# Patient Record
Sex: Female | Born: 1980 | Race: Black or African American | Hispanic: No | Marital: Single | Smoking: Never smoker
Health system: Southern US, Community
[De-identification: ages and names within clinical notes are randomized; demographics above are authoritative.]

## PROBLEM LIST (undated history)

## (undated) DIAGNOSIS — K644 Residual hemorrhoidal skin tags: Secondary | ICD-10-CM

## (undated) DIAGNOSIS — D219 Benign neoplasm of connective and other soft tissue, unspecified: Secondary | ICD-10-CM

## (undated) DIAGNOSIS — Z8619 Personal history of other infectious and parasitic diseases: Secondary | ICD-10-CM

## (undated) DIAGNOSIS — Z8709 Personal history of other diseases of the respiratory system: Secondary | ICD-10-CM

## (undated) DIAGNOSIS — T7840XA Allergy, unspecified, initial encounter: Secondary | ICD-10-CM

## (undated) DIAGNOSIS — D649 Anemia, unspecified: Secondary | ICD-10-CM

## (undated) HISTORY — DX: Anemia, unspecified: D64.9

## (undated) HISTORY — DX: Benign neoplasm of connective and other soft tissue, unspecified: D21.9

## (undated) HISTORY — DX: Allergy, unspecified, initial encounter: T78.40XA

## (undated) HISTORY — DX: Personal history of other infectious and parasitic diseases: Z86.19

## (undated) HISTORY — PX: COLONOSCOPY: SHX174

---

## 2003-07-25 ENCOUNTER — Emergency Department (HOSPITAL_COMMUNITY): Admission: EM | Admit: 2003-07-25 | Discharge: 2003-07-25 | Payer: Self-pay | Admitting: Emergency Medicine

## 2006-09-23 ENCOUNTER — Emergency Department (HOSPITAL_COMMUNITY): Admission: EM | Admit: 2006-09-23 | Discharge: 2006-09-23 | Payer: Self-pay | Admitting: Emergency Medicine

## 2007-05-29 ENCOUNTER — Other Ambulatory Visit: Admission: RE | Admit: 2007-05-29 | Discharge: 2007-05-29 | Payer: Self-pay | Admitting: Family Medicine

## 2007-06-12 ENCOUNTER — Emergency Department (HOSPITAL_COMMUNITY): Admission: EM | Admit: 2007-06-12 | Discharge: 2007-06-13 | Payer: Self-pay | Admitting: Emergency Medicine

## 2007-09-20 ENCOUNTER — Ambulatory Visit (HOSPITAL_COMMUNITY): Admission: RE | Admit: 2007-09-20 | Discharge: 2007-09-20 | Payer: Self-pay | Admitting: Obstetrics and Gynecology

## 2007-10-10 ENCOUNTER — Emergency Department (HOSPITAL_COMMUNITY): Admission: EM | Admit: 2007-10-10 | Discharge: 2007-10-11 | Payer: Self-pay | Admitting: Emergency Medicine

## 2007-11-07 ENCOUNTER — Ambulatory Visit (HOSPITAL_COMMUNITY): Admission: RE | Admit: 2007-11-07 | Discharge: 2007-11-07 | Payer: Self-pay | Admitting: Obstetrics and Gynecology

## 2008-03-26 ENCOUNTER — Inpatient Hospital Stay (HOSPITAL_COMMUNITY): Admission: AD | Admit: 2008-03-26 | Discharge: 2008-03-29 | Payer: Self-pay | Admitting: Obstetrics and Gynecology

## 2009-10-08 ENCOUNTER — Emergency Department (HOSPITAL_COMMUNITY): Admission: EM | Admit: 2009-10-08 | Discharge: 2009-10-08 | Payer: Self-pay | Admitting: Family Medicine

## 2010-01-13 ENCOUNTER — Emergency Department (HOSPITAL_COMMUNITY): Admission: EM | Admit: 2010-01-13 | Discharge: 2010-01-13 | Payer: Self-pay | Admitting: Family Medicine

## 2010-06-14 LAB — CBC
HCT: 30 % — ABNORMAL LOW (ref 36.0–46.0)
Hemoglobin: 9.9 g/dL — ABNORMAL LOW (ref 12.0–15.0)
MCV: 86.6 fL (ref 78.0–100.0)
RBC: 3.89 MIL/uL (ref 3.87–5.11)
RDW: 14.5 % (ref 11.5–15.5)
WBC: 7.4 10*3/uL (ref 4.0–10.5)

## 2010-06-14 LAB — RPR: RPR Ser Ql: NONREACTIVE

## 2010-07-13 NOTE — H&P (Signed)
NAME:  Meghan Fernandez, BIDDY NO.:  [id number]   MEDICAL RECORD NO.:  [id number]          PATIENT TYPE:  MAT   LOCATION:  [facility]                          FACILITY:  [facility]   PHYSICIAN:  Charles A. Delcambre, MDDATE OF BIRTH:  [birth year]   DATE OF ADMISSION:  DATE OF DISCHARGE:                              HISTORY & PHYSICAL   She is a 30 year old gravida 1, para 0 patient due on March 19, 2008.  Currently scheduled to be induced on March 26, 2008, 7:30 p.m. via  Cervidil at 41 weeks and 1 day expected laboring.  The pregnancy has  been complicated by an infarct and fibroid.  She has several large  fibroids even after 10 cm.  None appeared to be obstructing and pain has  decreased since first trimester. Otherwise, pregnancy complicated by  anemia.  She has used Vicodin off and on for the abdominal pain.  She is  taking prenatal vitamins and iron.   PAST SURGICAL HISTORY:  None.   PAST MEDICAL HISTORY:  History of asthma.   MEDICATIONS:  No medications used in years.   ALLERGIES:  CODEINE.  Reaction not specified, not allergic to latex.   SOCIAL HISTORY:  Unmarried, unsure of partners involved.   FAMILY HISTORY:  Unrelated.   OB LABS:  Initial hemoglobin 10.8, hematocrit 30.8, platelets 218.  A  positive, antibody screen negative, sickle cell negative.  VDRL  nonreactive, rubella immune, hepatitis B surface antigen nonreactive,  HIV nonreactive.  A 1-hour Glucola 95, hemoglobin at 28 weeks 10.7, HIV  at 36 weeks negative.  Group B strep positive at 36 weeks.  Quad screen  negative, declined cystic fibrosis, declined first trimester screening.  GC chlamydia were negative.  Pap was negative by history.   PHYSICAL EXAMINATION:  GENERAL:  Alert and oriented x3.  VITAL SIGNS:  Fetal heart rate 150s at 39 weeks and 6 days.  Fetal  movement is noted.  Weight 83 pounds.  Urine glucose, protein negative.  Blood pressure 112/78.  LUNGS:  Clear bilaterally.  Respirations  clear.  HEART:  Regular rate and rhythm, 2/6 systolic ejection murmur, left  sternal border.  ABDOMEN:  Gravid.  Fundal height is 44 with fibroids noted, nontender,  fetal heart rate in 150s.  EXTREMITIES:  Minimal edema bilaterally.  CERVIX:  Posterior and fingertip 1 cm most.   ASSESSMENT:  1. Intrauterine pregnancy, to be 41 weeks and 1 day estimated      gestational age.  2. Large fibroids.  3. Anemia.   PLAN:  Admit for induction at 7:30 p.m. on March 26, 2008, with  Cervidil.  All questions were answered.  Ambien 10 mg at bedtime p.r.n.  She may eat while she is not in labor with Cervidil intact.  If unable  to place Cervidil secondary to contractions, run Pitocin at 2 milli-  international units times per minute overnight.  In case of rupture of  membranes at appropriate time, begin low-dose Pitocin      Charles A. Sydnee Cabal, MD  Electronically Signed     CAD/MEDQ  D:  03/18/2008  T:  03/19/2008  Job:  [id number]

## 2010-09-10 LAB — ABO/RH: RH Type: POSITIVE

## 2010-09-10 LAB — GC/CHLAMYDIA PROBE AMP, GENITAL: Chlamydia: NEGATIVE

## 2010-09-10 LAB — RUBELLA ANTIBODY, IGM: Rubella: IMMUNE

## 2010-11-03 ENCOUNTER — Other Ambulatory Visit (HOSPITAL_COMMUNITY): Payer: Self-pay | Admitting: Obstetrics & Gynecology

## 2010-11-03 DIAGNOSIS — Z3682 Encounter for antenatal screening for nuchal translucency: Secondary | ICD-10-CM

## 2010-11-03 DIAGNOSIS — Z3689 Encounter for other specified antenatal screening: Secondary | ICD-10-CM

## 2010-11-03 DIAGNOSIS — O2690 Pregnancy related conditions, unspecified, unspecified trimester: Secondary | ICD-10-CM

## 2010-11-16 ENCOUNTER — Encounter (HOSPITAL_COMMUNITY): Payer: Self-pay

## 2010-11-16 ENCOUNTER — Ambulatory Visit (HOSPITAL_COMMUNITY)
Admission: RE | Admit: 2010-11-16 | Discharge: 2010-11-16 | Disposition: A | Discharge status: Home / Self Care | Payer: 59 | Source: Ambulatory Visit | Attending: Obstetrics & Gynecology | Admitting: Obstetrics & Gynecology

## 2010-11-16 ENCOUNTER — Ambulatory Visit (HOSPITAL_COMMUNITY): Payer: 59

## 2010-11-16 ENCOUNTER — Other Ambulatory Visit: Payer: Self-pay

## 2010-11-16 DIAGNOSIS — O3410 Maternal care for benign tumor of corpus uteri, unspecified trimester: Secondary | ICD-10-CM | POA: Insufficient documentation

## 2010-11-16 DIAGNOSIS — Z3689 Encounter for other specified antenatal screening: Secondary | ICD-10-CM

## 2010-11-16 DIAGNOSIS — O351XX0 Maternal care for (suspected) chromosomal abnormality in fetus, not applicable or unspecified: Secondary | ICD-10-CM | POA: Insufficient documentation

## 2010-11-16 DIAGNOSIS — Z3682 Encounter for antenatal screening for nuchal translucency: Secondary | ICD-10-CM

## 2010-11-16 DIAGNOSIS — O3510X0 Maternal care for (suspected) chromosomal abnormality in fetus, unspecified, not applicable or unspecified: Secondary | ICD-10-CM | POA: Insufficient documentation

## 2010-11-16 NOTE — Progress Notes (Signed)
Patient seen for ultrasound appointment today.  Please see [facility] report for details.  

## 2010-11-23 LAB — GC/CHLAMYDIA PROBE AMP, GENITAL
Chlamydia, DNA Probe: NEGATIVE
GC Probe Amp, Genital: NEGATIVE

## 2010-11-23 LAB — URINALYSIS, ROUTINE W REFLEX MICROSCOPIC
Glucose, UA: NEGATIVE
Hgb urine dipstick: NEGATIVE
Protein, ur: NEGATIVE
Specific Gravity, Urine: 1.025
pH: 7

## 2010-11-23 LAB — WET PREP, GENITAL: Yeast Wet Prep HPF POC: NONE SEEN

## 2010-11-23 LAB — URINE MICROSCOPIC-ADD ON

## 2010-12-28 ENCOUNTER — Ambulatory Visit (HOSPITAL_COMMUNITY)
Admission: RE | Admit: 2010-12-28 | Discharge: 2010-12-28 | Disposition: A | Discharge status: Home / Self Care | Payer: 59 | Source: Ambulatory Visit | Attending: Obstetrics & Gynecology | Admitting: Obstetrics & Gynecology

## 2010-12-28 DIAGNOSIS — Z1389 Encounter for screening for other disorder: Secondary | ICD-10-CM | POA: Insufficient documentation

## 2010-12-28 DIAGNOSIS — Z3689 Encounter for other specified antenatal screening: Secondary | ICD-10-CM

## 2010-12-28 DIAGNOSIS — Z363 Encounter for antenatal screening for malformations: Secondary | ICD-10-CM | POA: Insufficient documentation

## 2010-12-28 DIAGNOSIS — O358XX0 Maternal care for other (suspected) fetal abnormality and damage, not applicable or unspecified: Secondary | ICD-10-CM | POA: Insufficient documentation

## 2010-12-28 DIAGNOSIS — O3410 Maternal care for benign tumor of corpus uteri, unspecified trimester: Secondary | ICD-10-CM | POA: Insufficient documentation

## 2010-12-28 DIAGNOSIS — O2690 Pregnancy related conditions, unspecified, unspecified trimester: Secondary | ICD-10-CM

## 2011-01-25 ENCOUNTER — Encounter (HOSPITAL_COMMUNITY): Payer: Self-pay

## 2011-01-25 ENCOUNTER — Ambulatory Visit (HOSPITAL_COMMUNITY)
Admission: RE | Admit: 2011-01-25 | Discharge: 2011-01-25 | Disposition: A | Discharge status: Home / Self Care | Payer: 59 | Source: Ambulatory Visit | Attending: Obstetrics & Gynecology | Admitting: Obstetrics & Gynecology

## 2011-01-25 VITALS — BP 113/71 | HR 93 | Wt 276.5 lb

## 2011-01-25 DIAGNOSIS — O99210 Obesity complicating pregnancy, unspecified trimester: Secondary | ICD-10-CM

## 2011-01-25 DIAGNOSIS — O2690 Pregnancy related conditions, unspecified, unspecified trimester: Secondary | ICD-10-CM

## 2011-01-25 DIAGNOSIS — O3410 Maternal care for benign tumor of corpus uteri, unspecified trimester: Secondary | ICD-10-CM | POA: Insufficient documentation

## 2011-01-25 DIAGNOSIS — Z0489 Encounter for examination and observation for other specified reasons: Secondary | ICD-10-CM

## 2011-01-25 DIAGNOSIS — E669 Obesity, unspecified: Secondary | ICD-10-CM | POA: Insufficient documentation

## 2011-01-25 NOTE — Progress Notes (Signed)
Meghan Fernandez was seen for ultrasound appointment today.  Please see [facility] report for details.  

## 2011-02-24 ENCOUNTER — Ambulatory Visit (HOSPITAL_COMMUNITY)
Admission: RE | Admit: 2011-02-24 | Discharge: 2011-02-24 | Disposition: A | Discharge status: Home / Self Care | Payer: 59 | Source: Ambulatory Visit | Attending: Obstetrics & Gynecology | Admitting: Obstetrics & Gynecology

## 2011-02-24 DIAGNOSIS — E669 Obesity, unspecified: Secondary | ICD-10-CM | POA: Insufficient documentation

## 2011-02-24 DIAGNOSIS — Z0489 Encounter for examination and observation for other specified reasons: Secondary | ICD-10-CM

## 2011-02-24 DIAGNOSIS — O99210 Obesity complicating pregnancy, unspecified trimester: Secondary | ICD-10-CM | POA: Insufficient documentation

## 2011-02-24 DIAGNOSIS — O3410 Maternal care for benign tumor of corpus uteri, unspecified trimester: Secondary | ICD-10-CM | POA: Insufficient documentation

## 2011-03-01 NOTE — L&D Delivery Note (Signed)
Patient was C/C/+2 and pushed for 10 minutes with epidural.   NSVD  female infant, Apgars 8,9, weight P.   The patient had one midline second degree midline lacerations repaired with 2-0 vicryl R; a small sub-clitoral abrasion was not bleeding. Fundus was firm. EBL was expected. Placenta was delivered intact. Vagina was clear.  Baby was vigorous to bedside.  Meghan Fernandez A

## 2011-04-22 LAB — STREP B DNA PROBE: GBS: POSITIVE

## 2011-05-31 ENCOUNTER — Inpatient Hospital Stay (HOSPITAL_COMMUNITY)
Admission: AD | Admit: 2011-05-31 | Discharge: 2011-06-02 | DRG: 775 | Disposition: A | Discharge status: Home / Self Care | Payer: 59 | Source: Ambulatory Visit | Attending: Obstetrics and Gynecology | Admitting: Obstetrics and Gynecology

## 2011-05-31 ENCOUNTER — Encounter (HOSPITAL_COMMUNITY): Payer: Self-pay | Admitting: Obstetrics and Gynecology

## 2011-05-31 ENCOUNTER — Encounter (HOSPITAL_COMMUNITY): Payer: Self-pay | Admitting: *Deleted

## 2011-05-31 ENCOUNTER — Encounter (HOSPITAL_COMMUNITY): Payer: Self-pay | Admitting: Anesthesiology

## 2011-05-31 ENCOUNTER — Inpatient Hospital Stay (HOSPITAL_COMMUNITY): Payer: 59 | Admitting: Anesthesiology

## 2011-05-31 ENCOUNTER — Telehealth (HOSPITAL_COMMUNITY): Payer: Self-pay | Admitting: *Deleted

## 2011-05-31 LAB — CBC
HCT: 33.6 % — ABNORMAL LOW (ref 36.0–46.0)
Hemoglobin: 11.1 g/dL — ABNORMAL LOW (ref 12.0–15.0)
MCH: 27.5 pg (ref 26.0–34.0)
MCHC: 33 g/dL (ref 30.0–36.0)
RBC: 4.03 MIL/uL (ref 3.87–5.11)

## 2011-05-31 MED ORDER — METHYLERGONOVINE MALEATE 0.2 MG/ML IJ SOLN: 0.2000 mg | INTRAMUSCULAR | Status: DC | PRN

## 2011-05-31 MED ORDER — PHENYLEPHRINE 40 MCG/ML (10ML) SYRINGE FOR IV PUSH (FOR BLOOD PRESSURE SUPPORT): 80.0000 ug | PREFILLED_SYRINGE | INTRAVENOUS | Status: DC | PRN

## 2011-05-31 MED ORDER — DIPHENHYDRAMINE HCL 50 MG/ML IJ SOLN: 12.5000 mg | INTRAMUSCULAR | Status: DC | PRN

## 2011-05-31 MED ORDER — LACTATED RINGERS IV SOLN: INTRAVENOUS | Status: DC

## 2011-05-31 MED ORDER — ONDANSETRON HCL 4 MG PO TABS
4.0000 mg | ORAL_TABLET | ORAL | Status: DC | PRN
  Administered 2011-06-01: 4 mg via ORAL
  Filled 2011-05-31: qty 1

## 2011-05-31 MED ORDER — DIPHENHYDRAMINE HCL 25 MG PO CAPS: 25.0000 mg | ORAL_CAPSULE | Freq: Four times a day (QID) | ORAL | Status: DC | PRN

## 2011-05-31 MED ORDER — OXYTOCIN 20 UNITS IN LACTATED RINGERS INFUSION - SIMPLE
125.0000 mL/h | Freq: Once | INTRAVENOUS | Status: AC
  Administered 2011-05-31: 500 mL/h via INTRAVENOUS

## 2011-05-31 MED ORDER — OXYTOCIN BOLUS FROM INFUSION
500.0000 mL | Freq: Once | INTRAVENOUS | Status: DC
  Filled 2011-05-31: qty 500

## 2011-05-31 MED ORDER — CITRIC ACID-SODIUM CITRATE 334-500 MG/5ML PO SOLN: 30.0000 mL | ORAL | Status: DC | PRN

## 2011-05-31 MED ORDER — EPHEDRINE 5 MG/ML INJ: 10.0000 mg | INTRAVENOUS | Status: DC | PRN

## 2011-05-31 MED ORDER — ZOLPIDEM TARTRATE 5 MG PO TABS: 5.0000 mg | ORAL_TABLET | Freq: Every evening | ORAL | Status: DC | PRN

## 2011-05-31 MED ORDER — IBUPROFEN 800 MG PO TABS
800.0000 mg | ORAL_TABLET | Freq: Three times a day (TID) | ORAL | Status: DC
  Administered 2011-06-01 – 2011-06-02 (×3): 800 mg via ORAL
  Filled 2011-05-31 (×3): qty 1

## 2011-05-31 MED ORDER — SODIUM CHLORIDE 0.9 % IV SOLN: 250.0000 mL | INTRAVENOUS | Status: DC | PRN

## 2011-05-31 MED ORDER — ACETAMINOPHEN 325 MG PO TABS: 650.0000 mg | ORAL_TABLET | ORAL | Status: DC | PRN

## 2011-05-31 MED ORDER — FLEET ENEMA 7-19 GM/118ML RE ENEM: 1.0000 | ENEMA | RECTAL | Status: DC | PRN

## 2011-05-31 MED ORDER — LACTATED RINGERS IV SOLN
500.0000 mL | INTRAVENOUS | Status: DC | PRN
  Administered 2011-05-31: 1000 mL via INTRAVENOUS

## 2011-05-31 MED ORDER — LACTATED RINGERS IV SOLN: 500.0000 mL | INTRAVENOUS | Status: DC | PRN

## 2011-05-31 MED ORDER — EPHEDRINE 5 MG/ML INJ
10.0000 mg | INTRAVENOUS | Status: DC | PRN
  Filled 2011-05-31: qty 4

## 2011-05-31 MED ORDER — BENZOCAINE-MENTHOL 20-0.5 % EX AERO: 1.0000 "application " | INHALATION_SPRAY | CUTANEOUS | Status: DC | PRN

## 2011-05-31 MED ORDER — OXYCODONE-ACETAMINOPHEN 5-325 MG PO TABS: 1.0000 | ORAL_TABLET | ORAL | Status: DC | PRN

## 2011-05-31 MED ORDER — OXYTOCIN 20 UNITS IN LACTATED RINGERS INFUSION - SIMPLE: 125.0000 mL/h | INTRAVENOUS | Status: DC | PRN

## 2011-05-31 MED ORDER — SENNOSIDES-DOCUSATE SODIUM 8.6-50 MG PO TABS
2.0000 | ORAL_TABLET | Freq: Every day | ORAL | Status: DC
  Administered 2011-06-01: 2 via ORAL

## 2011-05-31 MED ORDER — LIDOCAINE HCL (PF) 1 % IJ SOLN
30.0000 mL | INTRAMUSCULAR | Status: DC | PRN
  Filled 2011-05-31: qty 30

## 2011-05-31 MED ORDER — WITCH HAZEL-GLYCERIN EX PADS
1.0000 "application " | MEDICATED_PAD | CUTANEOUS | Status: DC | PRN
  Administered 2011-06-01: 1 via TOPICAL

## 2011-05-31 MED ORDER — OXYTOCIN BOLUS FROM INFUSION
500.0000 mL | Freq: Once | INTRAVENOUS | Status: DC
  Filled 2011-05-31: qty 500
  Filled 2011-05-31: qty 1000

## 2011-05-31 MED ORDER — TETANUS-DIPHTH-ACELL PERTUSSIS 5-2.5-18.5 LF-MCG/0.5 IM SUSP
0.5000 mL | Freq: Once | INTRAMUSCULAR | Status: AC
  Administered 2011-06-01: 0.5 mL via INTRAMUSCULAR
  Filled 2011-05-31: qty 0.5

## 2011-05-31 MED ORDER — LIDOCAINE HCL (PF) 1 % IJ SOLN: 30.0000 mL | INTRAMUSCULAR | Status: DC | PRN

## 2011-05-31 MED ORDER — PHENYLEPHRINE 40 MCG/ML (10ML) SYRINGE FOR IV PUSH (FOR BLOOD PRESSURE SUPPORT)
80.0000 ug | PREFILLED_SYRINGE | INTRAVENOUS | Status: DC | PRN
  Filled 2011-05-31: qty 5

## 2011-05-31 MED ORDER — LIDOCAINE HCL (PF) 1 % IJ SOLN
INTRAMUSCULAR | Status: DC | PRN
  Administered 2011-05-31 (×2): 5 mL

## 2011-05-31 MED ORDER — LANOLIN HYDROUS EX OINT: TOPICAL_OINTMENT | CUTANEOUS | Status: DC | PRN

## 2011-05-31 MED ORDER — OXYCODONE-ACETAMINOPHEN 5-325 MG PO TABS
1.0000 | ORAL_TABLET | ORAL | Status: DC | PRN
  Administered 2011-06-01: 2 via ORAL
  Administered 2011-06-02: 1 via ORAL
  Filled 2011-05-31: qty 1
  Filled 2011-05-31: qty 2

## 2011-05-31 MED ORDER — PENICILLIN G POTASSIUM 5000000 UNITS IJ SOLR
5.0000 10*6.[IU] | Freq: Once | INTRAVENOUS | Status: AC
  Administered 2011-05-31: 5 10*6.[IU] via INTRAVENOUS
  Filled 2011-05-31: qty 5

## 2011-05-31 MED ORDER — METHYLERGONOVINE MALEATE 0.2 MG PO TABS: 0.2000 mg | ORAL_TABLET | ORAL | Status: DC | PRN

## 2011-05-31 MED ORDER — SODIUM CHLORIDE 0.9 % IJ SOLN: 3.0000 mL | Freq: Two times a day (BID) | INTRAMUSCULAR | Status: DC

## 2011-05-31 MED ORDER — DM-GUAIFENESIN ER 30-600 MG PO TB12
1.0000 | ORAL_TABLET | Freq: Two times a day (BID) | ORAL | Status: DC
  Administered 2011-06-01 – 2011-06-02 (×3): 1 via ORAL
  Filled 2011-05-31 (×4): qty 1

## 2011-05-31 MED ORDER — IBUPROFEN 600 MG PO TABS
600.0000 mg | ORAL_TABLET | Freq: Four times a day (QID) | ORAL | Status: DC | PRN
  Filled 2011-05-31: qty 1

## 2011-05-31 MED ORDER — DIBUCAINE 1 % RE OINT
1.0000 "application " | TOPICAL_OINTMENT | RECTAL | Status: DC | PRN
  Administered 2011-06-01: 1 via RECTAL
  Filled 2011-05-31: qty 28

## 2011-05-31 MED ORDER — LACTATED RINGERS IV SOLN
500.0000 mL | Freq: Once | INTRAVENOUS | Status: AC
  Administered 2011-05-31: 500 mL via INTRAVENOUS

## 2011-05-31 MED ORDER — MEASLES, MUMPS & RUBELLA VAC ~~LOC~~ INJ
0.5000 mL | INJECTION | Freq: Once | SUBCUTANEOUS | Status: DC
  Filled 2011-05-31: qty 0.5

## 2011-05-31 MED ORDER — SIMETHICONE 80 MG PO CHEW: 80.0000 mg | CHEWABLE_TABLET | ORAL | Status: DC | PRN

## 2011-05-31 MED ORDER — ONDANSETRON HCL 4 MG/2ML IJ SOLN: 4.0000 mg | Freq: Four times a day (QID) | INTRAMUSCULAR | Status: DC | PRN

## 2011-05-31 MED ORDER — PRENATAL MULTIVITAMIN CH
1.0000 | ORAL_TABLET | Freq: Every day | ORAL | Status: DC
  Administered 2011-06-01 – 2011-06-02 (×2): 1 via ORAL
  Filled 2011-05-31 (×2): qty 1

## 2011-05-31 MED ORDER — FERROUS SULFATE 325 (65 FE) MG PO TABS
325.0000 mg | ORAL_TABLET | Freq: Two times a day (BID) | ORAL | Status: DC
  Administered 2011-06-01 – 2011-06-02 (×3): 325 mg via ORAL
  Filled 2011-05-31 (×3): qty 1

## 2011-05-31 MED ORDER — LACTATED RINGERS IV SOLN
INTRAVENOUS | Status: DC
  Administered 2011-05-31: 12:00:00 via INTRAVENOUS

## 2011-05-31 MED ORDER — IBUPROFEN 600 MG PO TABS
600.0000 mg | ORAL_TABLET | Freq: Four times a day (QID) | ORAL | Status: DC | PRN
  Administered 2011-05-31: 600 mg via ORAL

## 2011-05-31 MED ORDER — NALBUPHINE SYRINGE 5 MG/0.5 ML: 5.0000 mg | INJECTION | INTRAMUSCULAR | Status: DC | PRN

## 2011-05-31 MED ORDER — MAGNESIUM HYDROXIDE 400 MG/5ML PO SUSP: 30.0000 mL | ORAL | Status: DC | PRN

## 2011-05-31 MED ORDER — OXYTOCIN 20 UNITS IN LACTATED RINGERS INFUSION - SIMPLE
125.0000 mL/h | Freq: Once | INTRAVENOUS | Status: AC
  Administered 2011-05-31: 125 mL/h via INTRAVENOUS

## 2011-05-31 MED ORDER — ONDANSETRON HCL 4 MG/2ML IJ SOLN: 4.0000 mg | INTRAMUSCULAR | Status: DC | PRN

## 2011-05-31 MED ORDER — SODIUM CHLORIDE 0.9 % IJ SOLN: 3.0000 mL | INTRAMUSCULAR | Status: DC | PRN

## 2011-05-31 MED ORDER — PENICILLIN G POTASSIUM 5000000 UNITS IJ SOLR
2.5000 10*6.[IU] | INTRAVENOUS | Status: DC
  Administered 2011-05-31: 2.5 10*6.[IU] via INTRAVENOUS
  Filled 2011-05-31 (×4): qty 2.5

## 2011-05-31 MED ORDER — FENTANYL 2.5 MCG/ML BUPIVACAINE 1/10 % EPIDURAL INFUSION (WH - ANES)
14.0000 mL/h | INTRAMUSCULAR | Status: DC
  Administered 2011-05-31: 14 mL/h via EPIDURAL
  Filled 2011-05-31: qty 60

## 2011-05-31 NOTE — MAU Note (Signed)
Pt sent from the office for labor. Pt was 5 cm in the office; G2P1 at [redacted]w[redacted]d

## 2011-05-31 NOTE — Anesthesia Preprocedure Evaluation (Signed)
Anesthesia Evaluation  Patient identified by MRN, date of birth, ID band Patient awake    Reviewed: Allergy & Precautions, H&P , Patient's Chart, lab work & pertinent test results  Airway Mallampati: III TM Distance: >3 FB Neck ROM: full    Dental No notable dental hx.    Pulmonary neg pulmonary ROS, asthma ,  breath sounds clear to auscultation  Pulmonary exam normal       Cardiovascular negative cardio ROS  Rhythm:regular Rate:Normal     Neuro/Psych negative neurological ROS  negative psych ROS   GI/Hepatic negative GI ROS, Neg liver ROS,   Endo/Other  negative endocrine ROSMorbid obesity  Renal/GU negative Renal ROS     Musculoskeletal   Abdominal   Peds  Hematology negative hematology ROS (+)   Anesthesia Other Findings   Reproductive/Obstetrics (+) Pregnancy                           Anesthesia Physical Anesthesia Plan  ASA: III  Anesthesia Plan: Epidural   Post-op Pain Management:    Induction:   Airway Management Planned:   Additional Equipment:   Intra-op Plan:   Post-operative Plan:   Informed Consent: I have reviewed the patients History and Physical, chart, labs and discussed the procedure including the risks, benefits and alternatives for the proposed anesthesia with the patient or authorized representative who has indicated his/her understanding and acceptance.     Plan Discussed with:   Anesthesia Plan Comments:         Anesthesia Quick Evaluation  

## 2011-05-31 NOTE — H&P (Signed)
30 y.o. [redacted]w[redacted]d  G2P1001 comes in c/o labor.  Otherwise has good fetal movement and no bleeding.  Past Medical History  Diagnosis Date  . History of chicken pox   . Fibroids   . Anemia   . Asthma     childhood no inhaler now  . Hemorrhoids in pregnancy    No past surgical history on file.  OB History    Grav Para Term Preterm Abortions TAB SAB Ect Mult Living   2 1 1  0 0 0 0 0 0 1     # Outc Date GA Lbr Len/2nd Wgt Sex Del Anes PTL Lv   1 TRM 2010 [redacted]w[redacted]d 05:00 7lb9oz(3.43kg) F SVD EPI  Yes   2 CUR               History   Social History  . Marital Status: Single    Spouse Name: N/A    Number of Children: N/A  . Years of Education: N/A   Occupational History  . Not on file.   Social History Main Topics  . Smoking status: Never Smoker   . Smokeless tobacco: Never Used  . Alcohol Use: No  . Drug Use: No  . Sexually Active: Yes   Other Topics Concern  . Not on file   Social History Narrative  . No narrative on file   Review of patient's allergies indicates no known allergies.   Prenatal Course:  Uncomplicated.   Filed Vitals:   05/31/11 1205  BP: 119/76  Pulse: 102  Temp: 97.8 F (36.6 C)  Resp: 18     Lungs/Cor:  NAD Abdomen:  soft, gravid Ex:  no cords, erythema SVE:  5/c/-2 FHTs:  120s, good STV, NST R Toco:  q5-15   A/P   Labor at term.  GBS Pos- PCN.  Meghan Fernandez A

## 2011-05-31 NOTE — Telephone Encounter (Signed)
Preadmission screen  

## 2011-05-31 NOTE — Progress Notes (Signed)
Dr. Henderson Cloud notified of patient arrival to MAU. L/D not able to take patient at this time, asked if MAU could take care of patient until bed is available. Dr Henderson Cloud notified of bed status. Orders received.

## 2011-05-31 NOTE — Anesthesia Procedure Notes (Signed)
Epidural Patient location during procedure: OB Start time: 05/31/2011 3:09 PM  Staffing Anesthesiologist: Brayton Caves R Performed by: anesthesiologist   Preanesthetic Checklist Completed: patient identified, site marked, surgical consent, pre-op evaluation, timeout performed, IV checked, risks and benefits discussed and monitors and equipment checked  Epidural Patient position: sitting Prep: site prepped and draped and DuraPrep Patient monitoring: continuous pulse ox and blood pressure Approach: midline Injection technique: LOR air and LOR saline  Needle:  Needle type: Tuohy  Needle gauge: 17 G Needle length: 9 cm Needle insertion depth: 7 cm Catheter type: closed end flexible Catheter size: 19 Gauge Catheter at skin depth: 12 cm Test dose: negative  Assessment Events: blood not aspirated, injection not painful, no injection resistance, negative IV test and no paresthesia  Additional Notes Patient identified.  Risk benefits discussed including failed block, incomplete pain control, headache, nerve damage, paralysis, blood pressure changes, nausea, vomiting, reactions to medication both toxic or allergic, and postpartum back pain.  Patient expressed understanding and wished to proceed.  All questions were answered.  Sterile technique used throughout procedure and epidural site dressed with sterile barrier dressing. No paresthesia or other complications noted.The patient did not experience any signs of intravascular injection such as tinnitus or metallic taste in mouth nor signs of intrathecal spread such as rapid motor block. Please see nursing notes for vital signs.

## 2011-06-01 ENCOUNTER — Encounter (HOSPITAL_COMMUNITY): Payer: Self-pay | Admitting: *Deleted

## 2011-06-01 LAB — CBC
MCH: 27.3 pg (ref 26.0–34.0)
MCHC: 32.6 g/dL (ref 30.0–36.0)
Platelets: 161 10*3/uL (ref 150–400)
RBC: 4 MIL/uL (ref 3.87–5.11)

## 2011-06-01 MED ORDER — BENZOCAINE-MENTHOL 20-0.5 % EX AERO
INHALATION_SPRAY | CUTANEOUS | Status: AC
  Administered 2011-06-01: 02:00:00
  Filled 2011-06-01: qty 56

## 2011-06-01 MED ORDER — HYDROCORTISONE ACE-PRAMOXINE 1-1 % RE FOAM
1.0000 | Freq: Two times a day (BID) | RECTAL | Status: DC
  Administered 2011-06-01 – 2011-06-02 (×2): 1 via RECTAL
  Filled 2011-06-01: qty 10

## 2011-06-01 NOTE — Progress Notes (Signed)
Post Partum Day 1 Subjective: Doing well.  Lochia appropriate.  Perineum sore secondary to tear.  Also c/o hemorrhoids  Objective: Filed Vitals:   05/31/11 2036 05/31/11 2136 06/01/11 0144 06/01/11 0826  BP: 111/71 134/78 106/69 114/74  Pulse: 55 55 56 56  Temp: 97 F (36.1 C) 98.1 F (36.7 C) 97.4 F (36.3 C) 98.4 F (36.9 C)  TempSrc: Oral Oral Oral Oral  Resp: 18 18 20 18   Height:      Weight:      SpO2: 100% 98% 98% 98%     Physical Exam:  General: alert, cooperative and appears stated age Lochia: appropriate Uterine Fundus: firm     Basename 06/01/11 0600 05/31/11 1212  HGB 10.9* 11.1*  HCT 33.4* 33.6*    Assessment/Plan: Routine postpartum care Breastfeeding Proctofoam   LOS: 1 day   Maicy Filip H. 06/01/2011, 9:34 AM

## 2011-06-01 NOTE — Anesthesia Postprocedure Evaluation (Signed)
  Anesthesia Post-op Note  Patient: Meghan Fernandez  Procedure(s) Performed: * No procedures listed *  Patient Location: Mother/Baby  Anesthesia Type: Epidural  Level of Consciousness: oriented  Airway and Oxygen Therapy: Patient Spontanous Breathing  Post-op Pain: mild  Post-op Assessment: Patient's Cardiovascular Status Stable and Respiratory Function Stable  Post-op Vital Signs: stable  Complications: No apparent anesthesia complications

## 2011-06-02 MED ORDER — IBUPROFEN 800 MG PO TABS: 800.0000 mg | ORAL_TABLET | Freq: Three times a day (TID) | ORAL | Status: AC

## 2011-06-02 NOTE — Discharge Summary (Signed)
Obstetric Discharge Summary Reason for Admission: onset of labor Prenatal Procedures: none Intrapartum Procedures: spontaneous vaginal delivery Postpartum Procedures: none Complications-Operative and Postpartum: 2 degree perineal laceration Hemoglobin  Date Value Range Status  06/01/2011 10.9* 12.0-15.0 (g/dL) Final     HCT  Date Value Range Status  06/01/2011 33.4* 36.0-46.0 (%) Final    Physical Exam:  General: alert and cooperative Lochia: appropriate Uterine Fundus: firm  DVT Evaluation: No evidence of DVT seen on physical exam.  Discharge Diagnoses: Term Pregnancy-delivered  Discharge Information: Date: 06/02/2011 Activity: pelvic rest Diet: routine Medications: PNV and Ibuprofen Condition: stable Instructions: refer to practice specific booklet Discharge to: home Follow-up Information    Follow up with HORVATH,MICHELLE A, MD in 4 weeks.   Contact information:   [address] [place] [address] [phone]          Newborn Data: Live born female  Birth Weight: 7 lb 13.2 oz (3549 g) APGAR: , 9  Home with mother.  Philip Aspen 06/02/2011, 9:58 AM

## 2011-06-03 ENCOUNTER — Inpatient Hospital Stay (HOSPITAL_COMMUNITY): Admission: RE | Admit: 2011-06-03 | Payer: 59 | Source: Ambulatory Visit

## 2011-06-29 ENCOUNTER — Encounter (HOSPITAL_COMMUNITY): Payer: Self-pay | Admitting: *Deleted

## 2012-04-30 ENCOUNTER — Other Ambulatory Visit (HOSPITAL_COMMUNITY)
Admission: RE | Admit: 2012-04-30 | Discharge: 2012-04-30 | Disposition: A | Discharge status: Home / Self Care | Payer: 59 | Source: Ambulatory Visit | Attending: Obstetrics and Gynecology | Admitting: Obstetrics and Gynecology

## 2012-04-30 ENCOUNTER — Other Ambulatory Visit: Payer: Self-pay | Admitting: Obstetrics and Gynecology

## 2012-04-30 DIAGNOSIS — Z1151 Encounter for screening for human papillomavirus (HPV): Secondary | ICD-10-CM | POA: Insufficient documentation

## 2012-04-30 DIAGNOSIS — Z01419 Encounter for gynecological examination (general) (routine) without abnormal findings: Secondary | ICD-10-CM | POA: Insufficient documentation

## 2013-12-30 ENCOUNTER — Encounter (HOSPITAL_COMMUNITY): Payer: Self-pay | Admitting: *Deleted

## 2015-05-22 ENCOUNTER — Ambulatory Visit: Payer: Self-pay | Admitting: General Surgery

## 2015-06-08 ENCOUNTER — Other Ambulatory Visit (HOSPITAL_COMMUNITY)
Admission: RE | Admit: 2015-06-08 | Discharge: 2015-06-08 | Disposition: A | Discharge status: Home / Self Care | Payer: 59 | Source: Ambulatory Visit | Attending: Internal Medicine | Admitting: Internal Medicine

## 2015-06-08 ENCOUNTER — Other Ambulatory Visit: Payer: Self-pay | Admitting: Internal Medicine

## 2015-06-08 DIAGNOSIS — Z1151 Encounter for screening for human papillomavirus (HPV): Secondary | ICD-10-CM | POA: Diagnosis not present

## 2015-06-08 DIAGNOSIS — Z01419 Encounter for gynecological examination (general) (routine) without abnormal findings: Secondary | ICD-10-CM | POA: Diagnosis present

## 2015-06-10 LAB — CYTOLOGY - PAP

## 2015-08-27 ENCOUNTER — Ambulatory Visit: Payer: Self-pay | Admitting: General Surgery

## 2015-09-28 ENCOUNTER — Encounter (HOSPITAL_BASED_OUTPATIENT_CLINIC_OR_DEPARTMENT_OTHER): Payer: Self-pay | Admitting: *Deleted

## 2015-09-28 NOTE — Progress Notes (Signed)
CALLED AND SPOKE W/ PT, TO DO PRE-OP.  PT STATES DOES NOT HAVE AN ADULT ARRANGED TO PICK UP AT DISCHARGE.  PER POLICY/ GUIDELINES PT'S ARE TO HAVE RESPONSIBLE ADULT >=35 YRS OLD DUE TO HAVING ANESTHESIA.  VERBALIZED UNDERSTANDING AND GAVE HER OR SCHEDULER NUMBER TO RESCHEDULE .

## 2015-11-05 ENCOUNTER — Ambulatory Visit (HOSPITAL_BASED_OUTPATIENT_CLINIC_OR_DEPARTMENT_OTHER): Admission: RE | Admit: 2015-11-05 | Payer: 59 | Source: Ambulatory Visit | Admitting: General Surgery

## 2015-11-05 ENCOUNTER — Encounter (HOSPITAL_BASED_OUTPATIENT_CLINIC_OR_DEPARTMENT_OTHER): Admission: RE | Payer: Self-pay | Source: Ambulatory Visit

## 2015-11-05 HISTORY — DX: Personal history of other diseases of the respiratory system: Z87.09

## 2015-11-05 HISTORY — DX: Residual hemorrhoidal skin tags: K64.4

## 2015-11-05 SURGERY — HEMORRHOIDECTOMY
Anesthesia: Choice

## 2016-07-12 ENCOUNTER — Other Ambulatory Visit: Payer: Self-pay | Admitting: Internal Medicine

## 2016-07-12 ENCOUNTER — Ambulatory Visit
Admission: RE | Admit: 2016-07-12 | Discharge: 2016-07-12 | Disposition: A | Discharge status: Home / Self Care | Payer: BLUE CROSS/BLUE SHIELD | Source: Ambulatory Visit | Attending: Internal Medicine | Admitting: Internal Medicine

## 2016-07-12 DIAGNOSIS — M25562 Pain in left knee: Secondary | ICD-10-CM

## 2016-07-12 DIAGNOSIS — M25561 Pain in right knee: Secondary | ICD-10-CM

## 2018-09-11 IMAGING — DX DG KNEE COMPLETE 4+V*R*
4 series · 4 of 4 positions shown · non-contrast
Comparison: None.

CLINICAL DATA: 35-year-old female bilateral knee pain which
increases with activity. Pain for several months more noticeable
last few weeks. No reported trauma. Initial encounter.

EXAM:
RIGHT KNEE - COMPLETE 4+ VIEW

[dg knee complete 4 views right (1 of 4)]
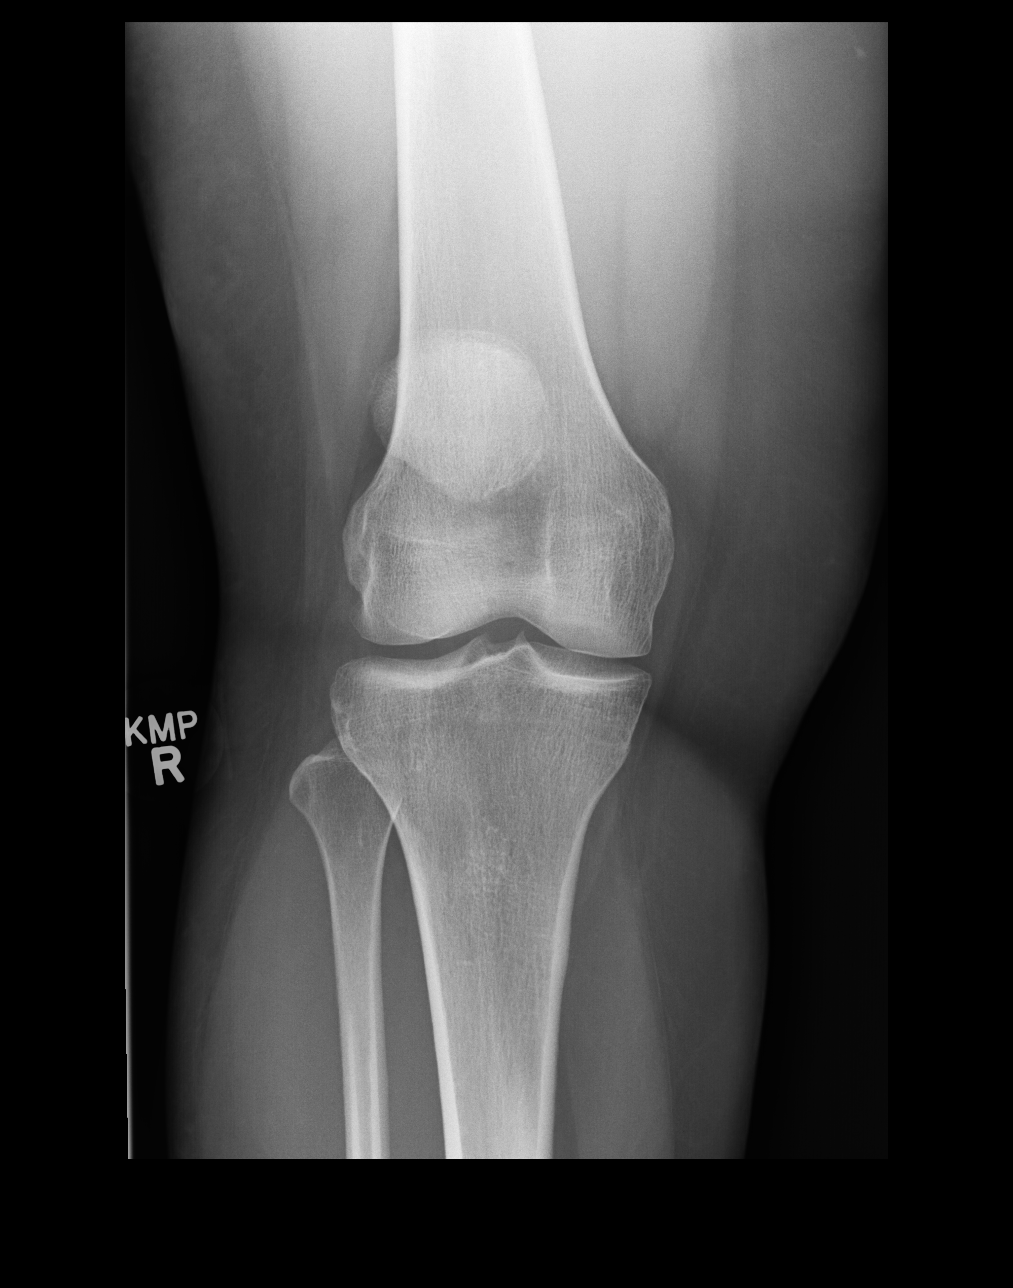

[dg knee complete 4 views right (2 of 4)]
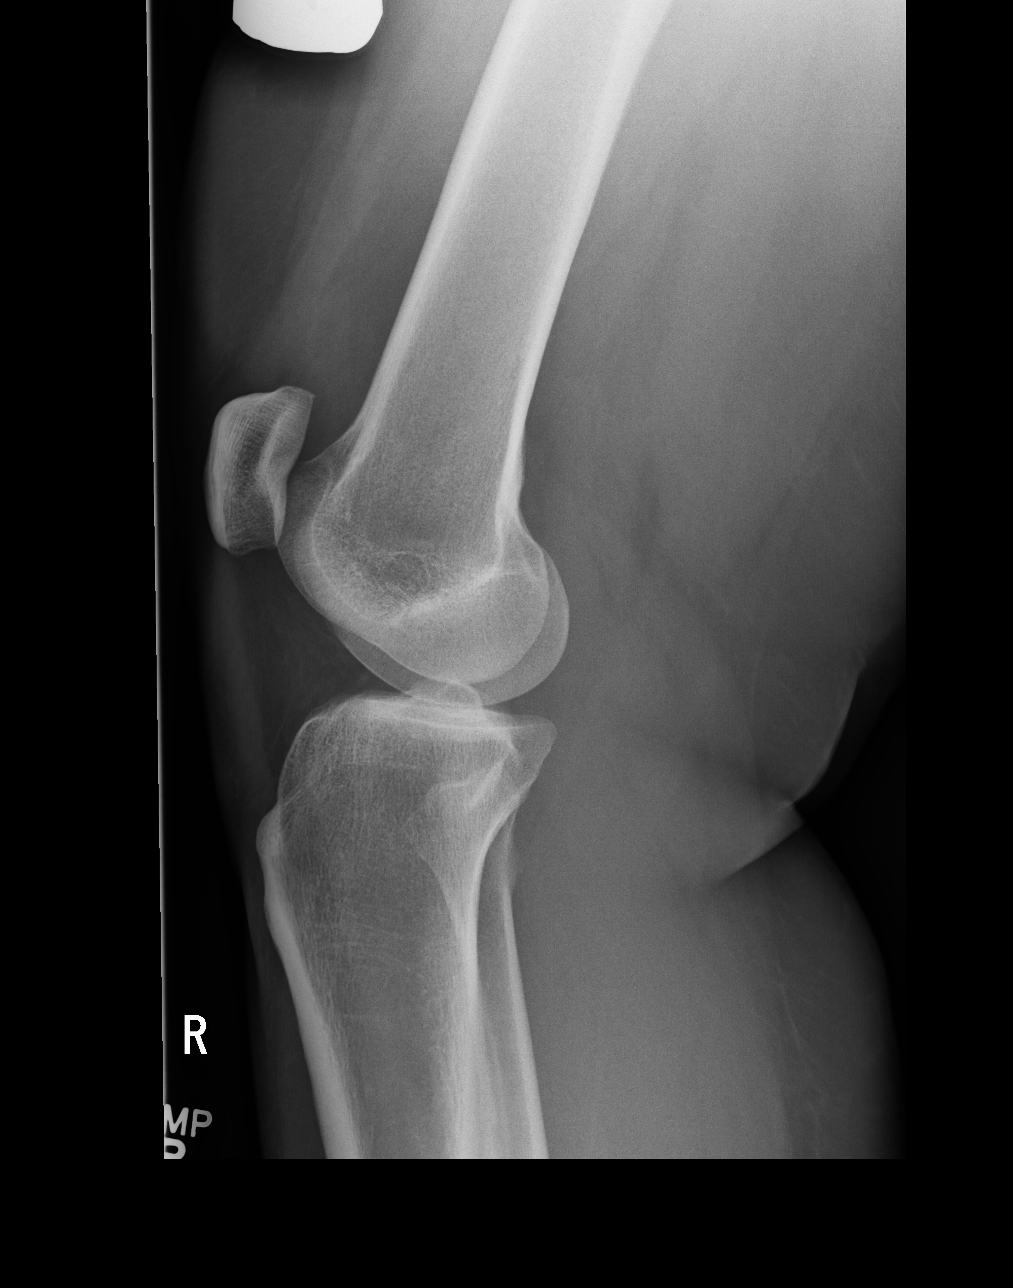

[dg knee complete 4 views right (3 of 4)]
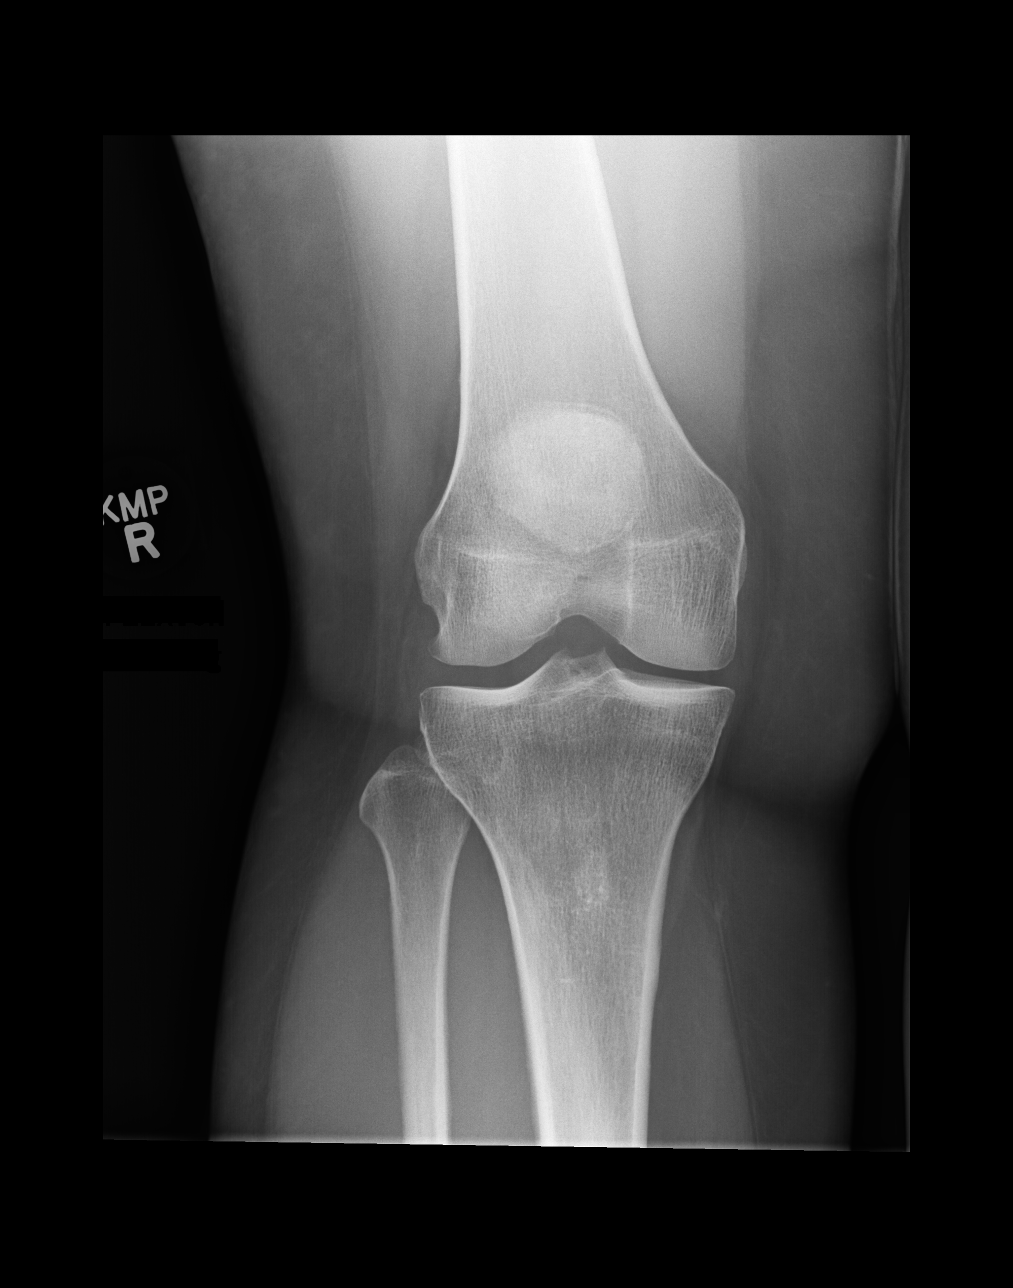

[dg knee complete 4 views right (4 of 4)]
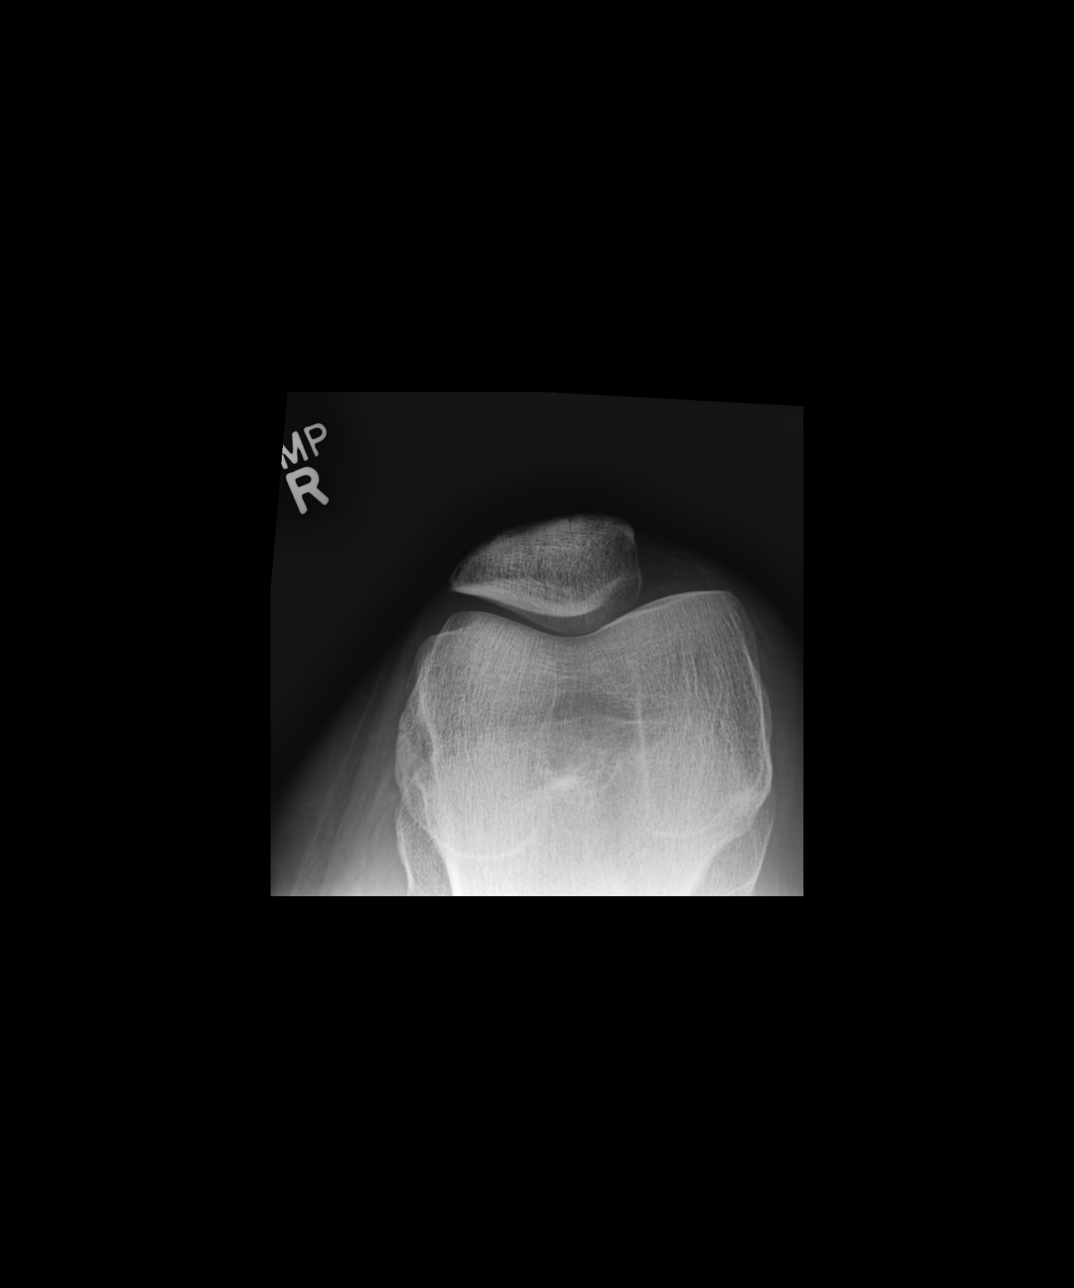

[4 of 4 positions shown; findings below may reference images not displayed]

FINDINGS: Slightly high riding patella patella.

No significant joint space narrowing.

No fracture or dislocation.

No soft tissue abnormality.
IMPRESSION: Slightly high riding patella otherwise negative exam.

## 2020-02-16 ENCOUNTER — Encounter (HOSPITAL_BASED_OUTPATIENT_CLINIC_OR_DEPARTMENT_OTHER): Payer: Self-pay | Admitting: Emergency Medicine

## 2020-02-16 ENCOUNTER — Emergency Department (HOSPITAL_BASED_OUTPATIENT_CLINIC_OR_DEPARTMENT_OTHER): Payer: Medicaid Other

## 2020-02-16 ENCOUNTER — Emergency Department (HOSPITAL_BASED_OUTPATIENT_CLINIC_OR_DEPARTMENT_OTHER)
Admission: EM | Admit: 2020-02-16 | Discharge: 2020-02-16 | Disposition: A | Discharge status: Home / Self Care | Payer: Medicaid Other | Attending: Emergency Medicine | Admitting: Emergency Medicine

## 2020-02-16 ENCOUNTER — Other Ambulatory Visit: Payer: Self-pay

## 2020-02-16 DIAGNOSIS — J45909 Unspecified asthma, uncomplicated: Secondary | ICD-10-CM | POA: Insufficient documentation

## 2020-02-16 DIAGNOSIS — M545 Low back pain, unspecified: Secondary | ICD-10-CM | POA: Diagnosis not present

## 2020-02-16 DIAGNOSIS — Z91010 Allergy to peanuts: Secondary | ICD-10-CM | POA: Insufficient documentation

## 2020-02-16 LAB — PREGNANCY, URINE: Preg Test, Ur: NEGATIVE

## 2020-02-16 MED ORDER — METHOCARBAMOL 500 MG PO TABS: 500.0000 mg | ORAL_TABLET | Freq: Two times a day (BID) | ORAL | 0 refills | Status: DC

## 2020-02-16 MED ORDER — PREDNISONE 10 MG PO TABS: 20.0000 mg | ORAL_TABLET | Freq: Every day | ORAL | 0 refills | Status: AC

## 2020-02-16 NOTE — ED Notes (Signed)
Ambulatory to room 5, gait steady

## 2020-02-16 NOTE — ED Provider Notes (Signed)
[facility] Provider Note   CSN: [id number] Arrival date & time: 02/16/20  3244     History Chief Complaint  Patient presents with  . Back Pain    Meghan Fernandez is a 39 y.o. female who presents to the ED today with complaint of gradual onset, constant, worsening, atraumatic, diffuse lower back pain x 2 days.  She reports she was sitting in her car driving her kids to school when she began having pain across her lower back with a tingling sensation down her left lower extremity.  Patient states she changed position and the tingling went away however she continued to have back pain.  She went to urgent care that day and was prescribed a muscle relaxer.  She states she took it that night without any improvement in her symptoms.  She called the urgent care back the next day and was told to take 1000 mg of Tylenol and if she did not have any more relief to go to the ER for worsening symptoms.  She states that the pain is worse with any type of movement especially bending down.  She states that if she stands completely still she does not have any pain.  She moves a certain way in her bed she will have the same tingling down her left lower extremity.  Patient has not taken anything else for pain.  Denies fevers, chills, urinary retention, urinary or bowel incontinence, saddle anesthesia, any other associated symptoms.  No history of IV drug use.  No history of prolonged steroid use.  No recent spinal manipulations.  No history of spinal surgery in the past.   The history is provided by the patient and medical records.       Past Medical History:  Diagnosis Date  . Anemia   . Asthma    childhood no inhaler now  . External hemorrhoids with complication   . Fibroids   . History of asthma    childhood  . History of chicken pox     There are no problems to display for this patient.   History reviewed. No pertinent surgical history.   OB History    Gravida   2   Para  2   Term  2   Preterm  0   AB  0   Living  2     SAB  0   IAB  0   Ectopic  0   Multiple  0   Live Births  2           Family History  Problem Relation Age of Onset  . Hypertension Mother   . Peripheral vascular disease Mother   . Cancer Mother        thyroid  . Diabetes Mother   . Hypertension Father   . Cancer Father        prostate  . Diabetes Sister   . Diabetes Brother     Social History   Tobacco Use  . Smoking status: Never Smoker  . Smokeless tobacco: Never Used  Substance Use Topics  . Alcohol use: No  . Drug use: No    Home Medications Prior to Admission medications   Medication Sig Start Date End Date Taking? Authorizing Provider  IRON PO Take 1 tablet by mouth daily.    [provider]  methocarbamol (ROBAXIN) 500 MG tablet Take 1 tablet (500 mg total) by mouth 2 (two) times daily. 02/16/20   Eustaquio Maize, PA-C  predniSONE (DELTASONE) 10 MG tablet Take 2 tablets (20 mg total) by mouth daily for 5 days. 02/16/20 02/21/20  Jeremia Groot, PA-C  PRENATAL VITAMINS PO Take by mouth.    [provider]    Allergies    Peanut-containing drug products  Review of Systems   Review of Systems  Constitutional: Negative for chills and fever.  Genitourinary: Negative for difficulty urinating.  Musculoskeletal: Positive for back pain.  Neurological: Negative for weakness and numbness.    Physical Exam Updated Vital Signs BP (!) 146/85 (BP Location: Left Arm)   Pulse 83   Temp 98.4 F (36.9 C) (Oral)   Resp 16   Ht 5\' 9"  (1.753 m)   Wt 113.4 kg   LMP 02/03/2020   SpO2 100%   BMI 36.92 kg/m   Physical Exam Vitals and nursing note reviewed.  Constitutional:      Appearance: She is not ill-appearing.  HENT:     Head: Normocephalic and atraumatic.  Eyes:     Conjunctiva/sclera: Conjunctivae normal.  Cardiovascular:     Rate and Rhythm: Normal rate and regular rhythm.     Pulses: Normal pulses.   Pulmonary:     Effort: Pulmonary effort is normal.     Breath sounds: Normal breath sounds. No wheezing, rhonchi or rales.  Musculoskeletal:     Comments: No C, T, or L midline spinal TTP. No bilateral paralumbar musculature TTP however pt points to her entire lower back when describing the pain. ROM limited to her back s/2 pain. Pt able to sit down on the bed however has to do this slowly. Equal strength and sensation in BLEs with 2+ DP pulses. Negative SLR bilaterally.   Skin:    General: Skin is warm and dry.     Coloration: Skin is not jaundiced.  Neurological:     Mental Status: She is alert.     ED Results / Procedures / Treatments   Labs (all labs ordered are listed, but only abnormal results are displayed) Labs Reviewed  PREGNANCY, URINE    EKG None  Radiology DG Lumbar Spine Complete  Result Date: 02/16/2020 CLINICAL DATA:  Lower back pain.  No known injury. EXAM: LUMBAR SPINE - COMPLETE 4+ VIEW COMPARISON:  Pelvic ultrasound 12/19/2011 FINDINGS: Normal anatomic alignment. No evidence for acute fracture or dislocation. Preservation of the vertebral body and intervertebral disc space heights. Multiple calcified fibroids are demonstrated within the pelvis. Lower lumbar spine facet degenerative changes. IMPRESSION: 1. No acute osseous abnormality. 2. Lower lumbar spine facet degenerative changes. Electronically Signed   By: Lovey Newcomer M.D.   On: 02/16/2020 12:37    Procedures Procedures (including critical care time)  Medications Ordered in ED Medications - No data to display  ED Course  I have reviewed the triage vital signs and the nursing notes.  Pertinent labs & imaging results that were available during my care of the patient were reviewed by me and considered in my medical decision making (see chart for details).    MDM Rules/Calculators/A&P                          39 year old female who presents to the ED today with complaint of atraumatic diffuse lower  back pain for the past 2 days unrelieved with Flexeril prescribed by urgent care.  Reports she has an intermittent tingling sensation down her left lower extremity with certain movements.  Pain is worse with bending.  On arrival to  the ED vitals are stable.  Patient is afebrile, nontachycardic and nontachypneic.  She denies any trauma to her back.  She has no red flag symptoms today concerning for cauda equina, spinal epidural abscess, AAA.  On exam she has no tenderness palpation whatsoever however she points to her diffuse lower back when describing the pain.  She has negative straight leg raise bilaterally.  I suspect that she is having muscle spasms due to standing up straight and not wanting to move.  However given that patient has never had pain like this before we will obtain an x-ray to see if there is any abnormality.  If negative will discharge home with a prednisone burst given she is having intermittent tingling in her left lower extremity as well as a new muscle relaxer and Salonpas patches with PCP follow-up.  Xray with degenerative changes. No other findings. Will discharge home at this time with different muscle relaxer, prednisone burst, and salon pas patches. Pt instructed to follow up with her PCP for same. She is in agreement with plan and stable for discharge home.   This note was prepared using Dragon voice recognition software and may include unintentional dictation errors due to the inherent limitations of voice recognition software.  Final Clinical Impression(s) / ED Diagnoses Final diagnoses:  Acute bilateral low back pain without sciatica    Rx / DC Orders ED Discharge Orders         Ordered    methocarbamol (ROBAXIN) 500 MG tablet  2 times daily        02/16/20 1256    predniSONE (DELTASONE) 10 MG tablet  Daily        02/16/20 1256           Discharge Instructions     Please pick up medication and take as prescribed. DO NOT DRIVE WHILE ON THE MUSCLE RELAXER AS IT  CAN MAKE YOU DROWSY.   I would recommend buying Salon Pas patches OTC to apply to your back to help with pain. Ask the pharmacist where you can find these in the store.   Follow up with your PCP regarding your ED visit today Return to the ED for any worsening symptoms including worsening pain, inability to ambulate, numbness in your groin, holding onto urine, peeing or pooping on yourself, or any other new/concerning symptoms.        Eustaquio Maize, PA-C 02/16/20 1257    Little, Wenda Overland, MD 02/16/20 [phone]

## 2020-02-16 NOTE — Discharge Instructions (Signed)
Please pick up medication and take as prescribed. DO NOT DRIVE WHILE ON THE MUSCLE RELAXER AS IT CAN MAKE YOU DROWSY.   I would recommend buying Salon Pas patches OTC to apply to your back to help with pain. Ask the pharmacist where you can find these in the store.   Follow up with your PCP regarding your ED visit today Return to the ED for any worsening symptoms including worsening pain, inability to ambulate, numbness in your groin, holding onto urine, peeing or pooping on yourself, or any other new/concerning symptoms.

## 2020-02-16 NOTE — ED Notes (Signed)
Will update vitals when pt returns from radiology

## 2020-02-16 NOTE — ED Triage Notes (Signed)
Low back pain x 2 days. Was seen at [facility] and given a muscle relaxer. Symptoms persist.

## 2020-02-16 NOTE — ED Notes (Signed)
Patient transported to X-ray 

## 2022-02-02 ENCOUNTER — Encounter: Payer: Self-pay | Admitting: Nurse Practitioner

## 2022-02-02 ENCOUNTER — Ambulatory Visit: Payer: Medicaid Other | Admitting: Nurse Practitioner

## 2022-02-02 VITALS — BP 124/84 | HR 75 | Temp 97.8°F | Ht 67.5 in | Wt 248.4 lb

## 2022-02-02 DIAGNOSIS — Z9109 Other allergy status, other than to drugs and biological substances: Secondary | ICD-10-CM | POA: Diagnosis not present

## 2022-02-02 DIAGNOSIS — K5909 Other constipation: Secondary | ICD-10-CM

## 2022-02-02 DIAGNOSIS — E669 Obesity, unspecified: Secondary | ICD-10-CM | POA: Insufficient documentation

## 2022-02-02 DIAGNOSIS — G4700 Insomnia, unspecified: Secondary | ICD-10-CM

## 2022-02-02 DIAGNOSIS — N920 Excessive and frequent menstruation with regular cycle: Secondary | ICD-10-CM

## 2022-02-02 DIAGNOSIS — R350 Frequency of micturition: Secondary | ICD-10-CM | POA: Insufficient documentation

## 2022-02-02 DIAGNOSIS — R5383 Other fatigue: Secondary | ICD-10-CM

## 2022-02-02 DIAGNOSIS — Z13220 Encounter for screening for lipoid disorders: Secondary | ICD-10-CM | POA: Diagnosis not present

## 2022-02-02 DIAGNOSIS — D649 Anemia, unspecified: Secondary | ICD-10-CM

## 2022-02-02 NOTE — Assessment & Plan Note (Signed)
She has a history of anemia due to her heavy menstrual bleeding.  She was taking an iron supplement in the past, however has not taken 1 in the past few years.  Will check CBC, iron panel, vitamin B12 today.  Treat based on results.

## 2022-02-02 NOTE — Assessment & Plan Note (Signed)
Chronic, stable.  She was seeing an allergist in the past, however it has not seen him in the past few years.  Follow-up if symptoms worsen or with any concerns.

## 2022-02-02 NOTE — Assessment & Plan Note (Signed)
She has a history of fibroids which is causing the heavy menstrual bleeding.  We will check FSH, LH, testosterone today to make sure there is no PCOS component with this and trouble losing weight.

## 2022-02-02 NOTE — Assessment & Plan Note (Signed)
Chronic, stable.  She takes smooth move every evening, otherwise she will have a bowel movement for a week.  Continue this regimen.  Follow-up if symptoms worsen.

## 2022-02-02 NOTE — Patient Instructions (Signed)
It was great to see you!  Start melatonin 3-'5mg'$  at bedtime. Try to avoid liquids after dinner. Limit screen time before bed.   Schedule a lab visit to come back fasting when you check out.   Let's follow-up in 4 weeks, sooner if you have concerns.  If a referral was placed today, you will be contacted for an appointment. Please note that routine referrals can sometimes take up to 3-4 weeks to process. Please call our office if you haven't heard anything after this time frame.  Take care,  Vance Peper, NP

## 2022-02-02 NOTE — Assessment & Plan Note (Signed)
BMI 38.3.  She has been trying for several years to lose weight.  She states she will lose some weight, then gained it back.  Recently she has been having trouble losing weight at all.  With her fatigue, constipation, and some hair loss she is wondering if it is more hormonal but is giving her trouble losing weight.  Will check CMP, CBC, A1c, TSH, cortisol, fasting insulin levels today.  Continue with healthy eating choices and exercise.  She has been to several nutritionist, and worked with a Engineer, maintenance in the past and still has been having trouble losing weight.

## 2022-02-02 NOTE — Assessment & Plan Note (Signed)
She has urinary frequency and urgency.  She went to uro-GYN and was told she needed Botox injections to help with her symptoms.  She is currently saving up some money for this.  Encouraged her to continue seeing Uro-GYN as needed.  Will check A1c today

## 2022-02-02 NOTE — Assessment & Plan Note (Signed)
She can try a melatonin supplement at bedtime to see if this helps with her sleeping.  She states that she can fall asleep, however she wakes up frequently.  Also discussed sleep hygiene, limiting screen time before bed and not drinking fluids after dinner with her urinary frequency.

## 2022-02-02 NOTE — Progress Notes (Signed)
New Patient Visit  BP 124/84 (BP Location: Left Arm, Patient Position: Sitting, Cuff Size: Large)   Pulse 75   Temp 97.8 F (36.6 C) (Temporal)   Ht 5' 7.5" (1.715 m)   Wt 248 lb 6.4 oz (112.7 kg)   LMP 01/26/2022   SpO2 99%   BMI 38.33 kg/m    Subjective:    Patient ID: Meghan Fernandez, female    DOB: October 01, 1980, 41 y.o.   MRN: [id number]  CC: Chief Complaint  Patient presents with   Establish Care    Weight management, fatigue, headache, no appetite would like to discuss hormone. Would like pap smear at CPE.     HPI: Meghan Fernandez is a 41 y.o. female presents for new patient visit to establish care.  Introduced to Designer, jewellery role and practice setting.  All questions answered.  Discussed provider/patient relationship and expectations.   She has been having headaches, fatigue, insomnia, and constipation. She takes smooth move in the evenings, otherwise she won't have a bowel movement for a week. She has been trying to eat healthy, however is not losing any weight. She has been exercising in the mornings. She is concerned some hormones may be off. She worked with a Engineer, maintenance in the past and lost minimal weight. She has talked to several nutritionists as well. She only drinks water and has been eating salads and chicken. She states that her headaches have gotten better, however they still occur intermittently. She doesn't take anything OTC for them as she doesn't like to take medicine. Her heaviest weight was after her second child at 284 pounds.   She has a history of urinary freuqency and is seeing UroGYN. She recommended botox injections, however she can't afford it at this time.   Depression and Anxiety Screen Done:     02/02/2022    8:43 AM  Depression screen PHQ 2/9  Decreased Interest 0  Down, Depressed, Hopeless 0  PHQ - 2 Score 0  Altered sleeping 3  Tired, decreased energy 1  Change in appetite 3  Feeling bad or failure about yourself  0  Trouble  concentrating 0  Moving slowly or fidgety/restless 0  Suicidal thoughts 0  PHQ-9 Score 7  Difficult doing work/chores Not difficult at all      02/02/2022    8:43 AM  GAD 7 : Generalized Anxiety Score  Nervous, Anxious, on Edge 0  Control/stop worrying 0  Worry too much - different things 0  Trouble relaxing 0  Restless 0  Easily annoyed or irritable 0  Afraid - awful might happen 0  Total GAD 7 Score 0  Anxiety Difficulty Not difficult at all    Past Medical History:  Diagnosis Date   Allergy    Anemia    Asthma    childhood no inhaler now   External hemorrhoids with complication    Fibroids    History of asthma    childhood   History of chicken pox     History reviewed. No pertinent surgical history.  Family History  Problem Relation Age of Onset   Hyperlipidemia Mother    Hypertension Mother    Peripheral vascular disease Mother    Cancer Mother        thyroid   Diabetes Mother    Hyperlipidemia Father    Hypertension Father    Cancer Father        prostate   Diabetes Sister    Diabetes Brother  Social History   Tobacco Use   Smoking status: Never    Passive exposure: Never   Smokeless tobacco: Never  Vaping Use   Vaping Use: Never used  Substance Use Topics   Alcohol use: No   Drug use: No    No current outpatient medications on file prior to visit.   No current facility-administered medications on file prior to visit.     Review of Systems  Constitutional:  Positive for fatigue. Negative for fever.  HENT: Negative.    Eyes: Negative.   Respiratory: Negative.    Cardiovascular: Negative.   Gastrointestinal:  Positive for constipation. Negative for abdominal pain, diarrhea, nausea and vomiting.  Genitourinary:  Positive for frequency. Negative for dysuria.       Heavy menstrual periods  Musculoskeletal: Negative.   Skin: Negative.   Neurological:  Positive for headaches. Negative for dizziness.  Psychiatric/Behavioral:  Positive  for sleep disturbance. Negative for dysphoric mood. The patient is not nervous/anxious.       Objective:    BP 124/84 (BP Location: Left Arm, Patient Position: Sitting, Cuff Size: Large)   Pulse 75   Temp 97.8 F (36.6 C) (Temporal)   Ht 5' 7.5" (1.715 m)   Wt 248 lb 6.4 oz (112.7 kg)   LMP 01/26/2022   SpO2 99%   BMI 38.33 kg/m   Wt Readings from Last 3 Encounters:  02/02/22 248 lb 6.4 oz (112.7 kg)  02/16/20 250 lb (113.4 kg)  05/31/11 290 lb (131.5 kg)    BP Readings from Last 3 Encounters:  02/02/22 124/84  02/16/20 120/79  06/02/11 116/75    Physical Exam Vitals and nursing note reviewed.  Constitutional:      General: She is not in acute distress.    Appearance: Normal appearance. She is obese.  HENT:     Head: Normocephalic and atraumatic.     Right Ear: Tympanic membrane, ear canal and external ear normal.     Left Ear: Tympanic membrane, ear canal and external ear normal.  Eyes:     Conjunctiva/sclera: Conjunctivae normal.  Cardiovascular:     Rate and Rhythm: Normal rate and regular rhythm.     Pulses: Normal pulses.     Heart sounds: Normal heart sounds.  Pulmonary:     Effort: Pulmonary effort is normal.     Breath sounds: Normal breath sounds.  Abdominal:     Palpations: Abdomen is soft.     Tenderness: There is no abdominal tenderness.  Musculoskeletal:        General: Normal range of motion.     Cervical back: Normal range of motion and neck supple. No tenderness.     Right lower leg: No edema.     Left lower leg: No edema.  Lymphadenopathy:     Cervical: No cervical adenopathy.  Skin:    General: Skin is warm and dry.  Neurological:     General: No focal deficit present.     Mental Status: She is alert and oriented to person, place, and time.     Cranial Nerves: No cranial nerve deficit.     Coordination: Coordination normal.     Gait: Gait normal.  Psychiatric:        Mood and Affect: Mood normal.        Behavior: Behavior normal.         Thought Content: Thought content normal.        Judgment: Judgment normal.       Assessment &  Plan:   Problem List Items Addressed This Visit       Digestive   Chronic constipation    Chronic, stable.  She takes smooth move every evening, otherwise she will have a bowel movement for a week.  Continue this regimen.  Follow-up if symptoms worsen.        Other   Environmental allergies - Primary    Chronic, stable.  She was seeing an allergist in the past, however it has not seen him in the past few years.  Follow-up if symptoms worsen or with any concerns.      Obesity (BMI 30-39.9)    BMI 38.3.  She has been trying for several years to lose weight.  She states she will lose some weight, then gained it back.  Recently she has been having trouble losing weight at all.  With her fatigue, constipation, and some hair loss she is wondering if it is more hormonal but is giving her trouble losing weight.  Will check CMP, CBC, A1c, TSH, cortisol, fasting insulin levels today.  Continue with healthy eating choices and exercise.  She has been to several nutritionist, and worked with a Engineer, maintenance in the past and still has been having trouble losing weight.      Relevant Orders   CBC with Differential/Platelet   Comprehensive metabolic panel   Lipid panel   Hemoglobin A1c   Insulin, random   Cortisol   Anemia    She has a history of anemia due to her heavy menstrual bleeding.  She was taking an iron supplement in the past, however has not taken 1 in the past few years.  Will check CBC, iron panel, vitamin B12 today.  Treat based on results.      Relevant Orders   CBC with Differential/Platelet   Vitamin B12   Iron, TIBC and Ferritin Panel   Insomnia    She can try a melatonin supplement at bedtime to see if this helps with her sleeping.  She states that she can fall asleep, however she wakes up frequently.  Also discussed sleep hygiene, limiting screen time before bed and not drinking  fluids after dinner with her urinary frequency.      Menorrhagia with regular cycle    She has a history of fibroids which is causing the heavy menstrual bleeding.  We will check FSH, LH, testosterone today to make sure there is no PCOS component with this and trouble losing weight.      Relevant Orders   FSH/LH   Testosterone   Urinary frequency    She has urinary frequency and urgency.  She went to uro-GYN and was told she needed Botox injections to help with her symptoms.  She is currently saving up some money for this.  Encouraged her to continue seeing Uro-GYN as needed.  Will check A1c today      Relevant Orders   Hemoglobin A1c   Other Visit Diagnoses     Fatigue, unspecified type       This has been occurring for a while, however she is having trouble sleeping at night.  Will check CMP, CBC, TSH, vitamin D today.   Relevant Orders   CBC with Differential/Platelet   Comprehensive metabolic panel   TSH   VITAMIN D 25 Hydroxy (Vit-D Deficiency, Fractures)   Screening, lipid       Screen lipid panel today   Relevant Orders   Lipid panel        Follow  up plan: Return in about 4 weeks (around 03/02/2022) for CPE.

## 2022-02-18 ENCOUNTER — Other Ambulatory Visit (INDEPENDENT_AMBULATORY_CARE_PROVIDER_SITE_OTHER): Payer: Medicaid Other

## 2022-02-18 DIAGNOSIS — N920 Excessive and frequent menstruation with regular cycle: Secondary | ICD-10-CM | POA: Diagnosis not present

## 2022-02-18 DIAGNOSIS — R350 Frequency of micturition: Secondary | ICD-10-CM | POA: Diagnosis not present

## 2022-02-18 DIAGNOSIS — Z13220 Encounter for screening for lipoid disorders: Secondary | ICD-10-CM | POA: Diagnosis not present

## 2022-02-18 DIAGNOSIS — E669 Obesity, unspecified: Secondary | ICD-10-CM

## 2022-02-18 DIAGNOSIS — D649 Anemia, unspecified: Secondary | ICD-10-CM

## 2022-02-18 DIAGNOSIS — R5383 Other fatigue: Secondary | ICD-10-CM | POA: Diagnosis not present

## 2022-02-18 LAB — CBC WITH DIFFERENTIAL/PLATELET
Basophils Absolute: 0 10*3/uL (ref 0.0–0.1)
Basophils Relative: 0.8 % (ref 0.0–3.0)
Eosinophils Absolute: 0.1 10*3/uL (ref 0.0–0.7)
Eosinophils Relative: 2.8 % (ref 0.0–5.0)
HCT: 37.6 % (ref 36.0–46.0)
Hemoglobin: 12.2 g/dL (ref 12.0–15.0)
Lymphocytes Relative: 37.4 % (ref 12.0–46.0)
Lymphs Abs: 0.9 10*3/uL (ref 0.7–4.0)
MCHC: 32.5 g/dL (ref 30.0–36.0)
MCV: 83.8 fl (ref 78.0–100.0)
Monocytes Absolute: 0.3 10*3/uL (ref 0.1–1.0)
Monocytes Relative: 10.5 % (ref 3.0–12.0)
Neutro Abs: 1.2 10*3/uL — ABNORMAL LOW (ref 1.4–7.7)
Neutrophils Relative %: 48.5 % (ref 43.0–77.0)
Platelets: 240 10*3/uL (ref 150.0–400.0)
RBC: 4.49 Mil/uL (ref 3.87–5.11)
RDW: 15.7 % — ABNORMAL HIGH (ref 11.5–15.5)
WBC: 2.4 10*3/uL — ABNORMAL LOW (ref 4.0–10.5)

## 2022-02-18 LAB — LIPID PANEL
Cholesterol: 193 mg/dL (ref 0–200)
HDL: 70.7 mg/dL (ref >39.00)
LDL Cholesterol: 114 mg/dL — ABNORMAL HIGH (ref 0–99)
NonHDL: 122.72
Total CHOL/HDL Ratio: 3
Triglycerides: 42 mg/dL (ref 0.0–149.0)
VLDL: 8.4 mg/dL (ref 0.0–40.0)

## 2022-02-18 LAB — COMPREHENSIVE METABOLIC PANEL
ALT: 10 U/L (ref 0–35)
AST: 16 U/L (ref 0–37)
Albumin: 3.8 g/dL (ref 3.5–5.2)
Alkaline Phosphatase: 52 U/L (ref 39–117)
BUN: 11 mg/dL (ref 6–23)
CO2: 26 mEq/L (ref 19–32)
Calcium: 9.1 mg/dL (ref 8.4–10.5)
Chloride: 107 mEq/L (ref 96–112)
Creatinine, Ser: 0.85 mg/dL (ref 0.40–1.20)
GFR: 85.08 mL/min (ref >60.00)
Glucose, Bld: 82 mg/dL (ref 70–99)
Potassium: 3.9 mEq/L (ref 3.5–5.1)
Sodium: 140 mEq/L (ref 135–145)
Total Bilirubin: 0.4 mg/dL (ref 0.2–1.2)
Total Protein: 7 g/dL (ref 6.0–8.3)

## 2022-02-18 LAB — HEMOGLOBIN A1C: Hgb A1c MFr Bld: 5.6 % (ref 4.6–6.5)

## 2022-02-19 LAB — TSH: TSH: 1.25 u[IU]/mL (ref 0.35–5.50)

## 2022-02-19 LAB — TESTOSTERONE: Testosterone: 33.61 ng/dL (ref 15.00–40.00)

## 2022-02-19 LAB — CORTISOL: Cortisol, Plasma: 7.3 ug/dL

## 2022-02-19 LAB — VITAMIN B12: Vitamin B-12: 268 pg/mL (ref 211–911)

## 2022-02-19 LAB — VITAMIN D 25 HYDROXY (VIT D DEFICIENCY, FRACTURES): VITD: 14.85 ng/mL — ABNORMAL LOW (ref 30.00–100.00)

## 2022-02-21 LAB — IRON,TIBC AND FERRITIN PANEL
%SAT: 9 % (calc) — ABNORMAL LOW (ref 16–45)
Ferritin: 6 ng/mL — ABNORMAL LOW (ref 16–232)
Iron: 37 ug/dL — ABNORMAL LOW (ref 40–190)
TIBC: 407 mcg/dL (calc) (ref 250–450)

## 2022-02-21 LAB — INSULIN, RANDOM: Insulin: 6.3 u[IU]/mL

## 2022-02-21 LAB — FSH/LH
FSH: 5.2 m[IU]/mL
LH: 6.2 m[IU]/mL

## 2022-02-22 MED ORDER — VITAMIN D (ERGOCALCIFEROL) 1.25 MG (50000 UNIT) PO CAPS
50000.0000 [IU] | ORAL_CAPSULE | ORAL | 0 refills | Status: DC
Start: 2022-02-22 — End: 2022-06-20

## 2022-02-28 NOTE — Progress Notes (Deleted)
LMP 01/26/2022    Subjective:    Patient ID: Meghan Fernandez, female    DOB: 04-30-1980, 42 y.o.   MRN: [id number]  CC: No chief complaint on file.  HPI: Meghan Fernandez is a 42 y.o. female presenting on 03/02/2022 for comprehensive medical examination. Current medical complaints include:{Blank single:19197::"none","***"}  She currently lives with: Menopausal Symptoms: {Blank single:19197::"yes","no"}  Depression Screen done today and results listed below:     02/02/2022    8:43 AM  Depression screen PHQ 2/9  Decreased Interest 0  Down, Depressed, Hopeless 0  PHQ - 2 Score 0  Altered sleeping 3  Tired, decreased energy 1  Change in appetite 3  Feeling bad or failure about yourself  0  Trouble concentrating 0  Moving slowly or fidgety/restless 0  Suicidal thoughts 0  PHQ-9 Score 7  Difficult doing work/chores Not difficult at all    The patient {has/does not have:19849} a history of falls. I {did/did not:19850} complete a risk assessment for falls. A plan of care for falls {was/was not:19852} documented.   Past Medical History:  Past Medical History:  Diagnosis Date   Allergy    Anemia    Asthma    childhood no inhaler now   External hemorrhoids with complication    Fibroids    History of asthma    childhood   History of chicken pox     Surgical History:  No past surgical history on file.  Medications:  Current Outpatient Medications on File Prior to Visit  Medication Sig   Vitamin D, Ergocalciferol, (DRISDOL) 1.25 MG (50000 UNIT) CAPS capsule Take 1 capsule (50,000 Units total) by mouth every 7 (seven) days.   No current facility-administered medications on file prior to visit.    Allergies:  Allergies  Allergen Reactions   Peanut-Containing Drug Products Anaphylaxis    Social History:  Social History   Socioeconomic History   Marital status: Single    Spouse name: Not on file   Number of children: Not on file   Years of education: Not on file    Highest education level: Not on file  Occupational History   Not on file  Tobacco Use   Smoking status: Never    Passive exposure: Never   Smokeless tobacco: Never  Vaping Use   Vaping Use: Never used  Substance and Sexual Activity   Alcohol use: No   Drug use: No   Sexual activity: Yes    Birth control/protection: None  Other Topics Concern   Not on file  Social History Narrative   Not on file   Social Determinants of Health   Financial Resource Strain: Not on file  Food Insecurity: Not on file  Transportation Needs: Not on file  Physical Activity: Not on file  Stress: Not on file  Social Connections: Not on file  Intimate Partner Violence: Not on file   Social History   Tobacco Use  Smoking Status Never   Passive exposure: Never  Smokeless Tobacco Never   Social History   Substance and Sexual Activity  Alcohol Use No    Family History:  Family History  Problem Relation Age of Onset   Hyperlipidemia Mother    Hypertension Mother    Peripheral vascular disease Mother    Cancer Mother        thyroid   Diabetes Mother    Hyperlipidemia Father    Hypertension Father    Cancer Father  prostate   Diabetes Sister    Diabetes Brother     Past medical history, surgical history, medications, allergies, family history and social history reviewed with patient today and changes made to appropriate areas of the chart.   ROS All other ROS negative except what is listed above and in the HPI.      Objective:    LMP 01/26/2022   Wt Readings from Last 3 Encounters:  02/02/22 248 lb 6.4 oz (112.7 kg)  02/16/20 250 lb (113.4 kg)  05/31/11 290 lb (131.5 kg)    Physical Exam  Results for orders placed or performed in visit on 02/18/22  CBC with Differential/Platelet  Result Value Ref Range   WBC 2.4 Repeated and verified X2. (L) 4.0 - 10.5 K/uL   RBC 4.49 3.87 - 5.11 Mil/uL   Hemoglobin 12.2 12.0 - 15.0 g/dL   HCT 37.6 36.0 - 46.0 %   MCV 83.8 78.0  - 100.0 fl   MCHC 32.5 30.0 - 36.0 g/dL   RDW 15.7 (H) 11.5 - 15.5 %   Platelets 240.0 150.0 - 400.0 K/uL   Neutrophils Relative % 48.5 43.0 - 77.0 %   Lymphocytes Relative 37.4 12.0 - 46.0 %   Monocytes Relative 10.5 3.0 - 12.0 %   Eosinophils Relative 2.8 0.0 - 5.0 %   Basophils Relative 0.8 0.0 - 3.0 %   Neutro Abs 1.2 (L) 1.4 - 7.7 K/uL   Lymphs Abs 0.9 0.7 - 4.0 K/uL   Monocytes Absolute 0.3 0.1 - 1.0 K/uL   Eosinophils Absolute 0.1 0.0 - 0.7 K/uL   Basophils Absolute 0.0 0.0 - 0.1 K/uL  Comprehensive metabolic panel  Result Value Ref Range   Sodium 140 135 - 145 mEq/L   Potassium 3.9 3.5 - 5.1 mEq/L   Chloride 107 96 - 112 mEq/L   CO2 26 19 - 32 mEq/L   Glucose, Bld 82 70 - 99 mg/dL   BUN 11 6 - 23 mg/dL   Creatinine, Ser 0.85 0.40 - 1.20 mg/dL   Total Bilirubin 0.4 0.2 - 1.2 mg/dL   Alkaline Phosphatase 52 39 - 117 U/L   AST 16 0 - 37 U/L   ALT 10 0 - 35 U/L   Total Protein 7.0 6.0 - 8.3 g/dL   Albumin 3.8 3.5 - 5.2 g/dL   GFR 85.08 >60.00 mL/min   Calcium 9.1 8.4 - 10.5 mg/dL  Lipid panel  Result Value Ref Range   Cholesterol 193 0 - 200 mg/dL   Triglycerides 42.0 0.0 - 149.0 mg/dL   HDL 70.70 >39.00 mg/dL   VLDL 8.4 0.0 - 40.0 mg/dL   LDL Cholesterol 114 (H) 0 - 99 mg/dL   Total CHOL/HDL Ratio 3    NonHDL 122.72   Hemoglobin A1c  Result Value Ref Range   Hgb A1c MFr Bld 5.6 4.6 - 6.5 %  TSH  Result Value Ref Range   TSH 1.25 0.35 - 5.50 uIU/mL  VITAMIN D 25 Hydroxy (Vit-D Deficiency, Fractures)  Result Value Ref Range   VITD 14.85 (L) 30.00 - 100.00 ng/mL  Vitamin B12  Result Value Ref Range   Vitamin B-12 268 211 - 911 pg/mL  Iron, TIBC and Ferritin Panel  Result Value Ref Range   Iron 37 (L) 40 - 190 mcg/dL   TIBC 407 250 - 450 mcg/dL (calc)   %SAT 9 (L) 16 - 45 % (calc)   Ferritin 6 (L) 16 - 232 ng/mL  Insulin, random  Result  Value Ref Range   Insulin 6.3 uIU/mL  Cortisol  Result Value Ref Range   Cortisol, Plasma 7.3 ug/dL  FSH/LH  Result  Value Ref Range   FSH 5.2 mIU/mL   LH 6.2 mIU/mL  Testosterone  Result Value Ref Range   Testosterone 33.61 15.00 - 40.00 ng/dL      Assessment & Plan:   Problem List Items Addressed This Visit   None    Follow up plan: No follow-ups on file.   LABORATORY TESTING:  - Pap smear: {Blank EBRAXE:94076::"[facility] done","not applicable","up to date","done elsewhere"}  IMMUNIZATIONS:   - Tdap: Tetanus vaccination status reviewed: {tetanus status:315746}. - Influenza: {Blank single:19197::"Up to date","Administered today","Postponed to flu season","Refused","Given elsewhere"} - Pneumovax: {Blank single:19197::"Up to date","Administered today","Not applicable","Refused","Given elsewhere"} - Prevnar: {Blank single:19197::"Up to date","Administered today","Not applicable","Refused","Given elsewhere"} - HPV: {Blank single:19197::"Up to date","Administered today","Not applicable","Refused","Given elsewhere"} - Zostavax vaccine: {Blank single:19197::"Up to date","Administered today","Not applicable","Refused","Given elsewhere"}  SCREENING: -Mammogram: {Blank single:19197::"Up to date","Ordered today","Not applicable","Refused","Done elsewhere"}  - Colonoscopy: {Blank single:19197::"Up to date","Ordered today","Not applicable","Refused","Done elsewhere"}  - Bone Density: {Blank single:19197::"Up to date","Ordered today","Not applicable","Refused","Done elsewhere"}  -Hearing Test: {Blank single:19197::"Up to date","Ordered today","Not applicable","Refused","Done elsewhere"}  -Spirometry: {Blank single:19197::"Up to date","Ordered today","Not applicable","Refused","Done elsewhere"}   PATIENT COUNSELING:   Advised to take 1 mg of folate supplement per day if capable of pregnancy.   Sexuality: Discussed sexually transmitted diseases, partner selection, use of condoms, avoidance of unintended pregnancy  and contraceptive alternatives.   Advised to avoid cigarette smoking.  I discussed with the  patient that most people either abstain from alcohol or drink within safe limits (<=14/week and <=4 drinks/occasion for males, <=7/weeks and <= 3 drinks/occasion for females) and that the risk for alcohol disorders and other health effects rises proportionally with the number of drinks per week and how often a drinker exceeds daily limits.  Discussed cessation/primary prevention of drug use and availability of treatment for abuse.   Diet: Encouraged to adjust caloric intake to maintain  or achieve ideal body weight, to reduce intake of dietary saturated fat and total fat, to limit sodium intake by avoiding high sodium foods and not adding table salt, and to maintain adequate dietary potassium and calcium preferably from fresh fruits, vegetables, and low-fat dairy products.    stressed the importance of regular exercise  Injury prevention: Discussed safety belts, safety helmets, smoke detector, smoking near bedding or upholstery.   Dental health: Discussed importance of regular tooth brushing, flossing, and dental visits.    NEXT PREVENTATIVE PHYSICAL DUE IN 1 YEAR. No follow-ups on file.

## 2022-03-02 ENCOUNTER — Encounter: Payer: Medicaid Other | Admitting: Nurse Practitioner

## 2022-03-10 ENCOUNTER — Ambulatory Visit (INDEPENDENT_AMBULATORY_CARE_PROVIDER_SITE_OTHER): Payer: Medicaid Other | Admitting: Nurse Practitioner

## 2022-03-10 ENCOUNTER — Other Ambulatory Visit (HOSPITAL_COMMUNITY)
Admission: RE | Admit: 2022-03-10 | Discharge: 2022-03-10 | Disposition: A | Discharge status: Home / Self Care | Payer: Medicaid Other | Source: Ambulatory Visit | Attending: Nurse Practitioner | Admitting: Nurse Practitioner

## 2022-03-10 ENCOUNTER — Encounter: Payer: Self-pay | Admitting: Nurse Practitioner

## 2022-03-10 VITALS — BP 110/70 | HR 58 | Temp 96.0°F | Ht 67.5 in | Wt 253.6 lb

## 2022-03-10 DIAGNOSIS — E559 Vitamin D deficiency, unspecified: Secondary | ICD-10-CM | POA: Diagnosis not present

## 2022-03-10 DIAGNOSIS — Z1231 Encounter for screening mammogram for malignant neoplasm of breast: Secondary | ICD-10-CM | POA: Diagnosis not present

## 2022-03-10 DIAGNOSIS — D500 Iron deficiency anemia secondary to blood loss (chronic): Secondary | ICD-10-CM

## 2022-03-10 DIAGNOSIS — D72819 Decreased white blood cell count, unspecified: Secondary | ICD-10-CM

## 2022-03-10 DIAGNOSIS — E669 Obesity, unspecified: Secondary | ICD-10-CM | POA: Diagnosis not present

## 2022-03-10 DIAGNOSIS — Z23 Encounter for immunization: Secondary | ICD-10-CM

## 2022-03-10 DIAGNOSIS — Z0000 Encounter for general adult medical examination without abnormal findings: Secondary | ICD-10-CM | POA: Diagnosis not present

## 2022-03-10 DIAGNOSIS — Z124 Encounter for screening for malignant neoplasm of cervix: Secondary | ICD-10-CM | POA: Diagnosis not present

## 2022-03-10 LAB — CBC WITH DIFFERENTIAL/PLATELET
Basophils Absolute: 0 10*3/uL (ref 0.0–0.1)
Basophils Relative: 0.4 % (ref 0.0–3.0)
Eosinophils Absolute: 0.1 10*3/uL (ref 0.0–0.7)
Eosinophils Relative: 2.7 % (ref 0.0–5.0)
HCT: 35.3 % — ABNORMAL LOW (ref 36.0–46.0)
Hemoglobin: 11.7 g/dL — ABNORMAL LOW (ref 12.0–15.0)
Lymphocytes Relative: 27.4 % (ref 12.0–46.0)
Lymphs Abs: 0.9 10*3/uL (ref 0.7–4.0)
MCHC: 33.1 g/dL (ref 30.0–36.0)
MCV: 82.6 fl (ref 78.0–100.0)
Monocytes Absolute: 0.5 10*3/uL (ref 0.1–1.0)
Monocytes Relative: 14.2 % — ABNORMAL HIGH (ref 3.0–12.0)
Neutro Abs: 1.9 10*3/uL (ref 1.4–7.7)
Neutrophils Relative %: 55.3 % (ref 43.0–77.0)
Platelets: 223 10*3/uL (ref 150.0–400.0)
RBC: 4.27 Mil/uL (ref 3.87–5.11)
RDW: 15.5 % (ref 11.5–15.5)
WBC: 3.4 10*3/uL — ABNORMAL LOW (ref 4.0–10.5)

## 2022-03-10 NOTE — Progress Notes (Signed)
BP 110/70   Pulse (!) 58   Temp (!) 96 F (35.6 C)   Ht 5' 7.5" (1.715 m)   Wt 253 lb 9.6 oz (115 kg)   LMP 03/01/2022   SpO2 97%   BMI 39.13 kg/m    Subjective:    Patient ID: Meghan Fernandez, female    DOB: 02/15/1981, 42 y.o.   MRN: [id number]  CC: Chief Complaint  Patient presents with   Annual Exam    Physical  pt is fasting for lab , pt has no concerns     HPI: Meghan Fernandez is a 42 y.o. female presenting on 03/10/2022 for comprehensive medical examination. Current medical complaints include:none   Depression Screen done and results listed below:     02/02/2022    8:43 AM  Depression screen PHQ 2/9  Decreased Interest 0  Down, Depressed, Hopeless 0  PHQ - 2 Score 0  Altered sleeping 3  Tired, decreased energy 1  Change in appetite 3  Feeling bad or failure about yourself  0  Trouble concentrating 0  Moving slowly or fidgety/restless 0  Suicidal thoughts 0  PHQ-9 Score 7  Difficult doing work/chores Not difficult at all    The patient does not have a history of falls. I did not complete a risk assessment for falls. A plan of care for falls was not documented.   Past Medical History:  Past Medical History:  Diagnosis Date   Allergy    Anemia    Asthma    childhood no inhaler now   External hemorrhoids with complication    Fibroids    History of asthma    childhood   History of chicken pox     Surgical History:  History reviewed. No pertinent surgical history.  Medications:  Current Outpatient Medications on File Prior to Visit  Medication Sig   Vitamin D, Ergocalciferol, (DRISDOL) 1.25 MG (50000 UNIT) CAPS capsule Take 1 capsule (50,000 Units total) by mouth every 7 (seven) days.   No current facility-administered medications on file prior to visit.    Allergies:  No Active Allergies  Social History:  Social History   Socioeconomic History   Marital status: Single    Spouse name: Not on file   Number of children: Not on file    Years of education: Not on file   Highest education level: Not on file  Occupational History   Not on file  Tobacco Use   Smoking status: Never    Passive exposure: Never   Smokeless tobacco: Never  Vaping Use   Vaping Use: Never used  Substance and Sexual Activity   Alcohol use: No   Drug use: No   Sexual activity: Yes    Birth control/protection: None  Other Topics Concern   Not on file  Social History Narrative   Not on file   Social Determinants of Health   Financial Resource Strain: Not on file  Food Insecurity: Not on file  Transportation Needs: Not on file  Physical Activity: Not on file  Stress: Not on file  Social Connections: Not on file  Intimate Partner Violence: Not on file   Social History   Tobacco Use  Smoking Status Never   Passive exposure: Never  Smokeless Tobacco Never   Social History   Substance and Sexual Activity  Alcohol Use No    Family History:  Family History  Problem Relation Age of Onset   Hyperlipidemia Mother    Hypertension Mother  Peripheral vascular disease Mother    Cancer Mother        thyroid   Diabetes Mother    Hyperlipidemia Father    Hypertension Father    Cancer Father        prostate   Diabetes Sister    Diabetes Brother     Past medical history, surgical history, medications, allergies, family history and social history reviewed with patient today and changes made to appropriate areas of the chart.   Review of Systems  Constitutional:  Positive for malaise/fatigue. Negative for fever.  HENT: Negative.    Respiratory: Negative.    Cardiovascular: Negative.   Gastrointestinal: Negative.   Genitourinary: Negative.   Musculoskeletal: Negative.   Skin: Negative.   Neurological: Negative.   Psychiatric/Behavioral: Negative.     All other ROS negative except what is listed above and in the HPI.      Objective:    BP 110/70   Pulse (!) 58   Temp (!) 96 F (35.6 C)   Ht 5' 7.5" (1.715 m)   Wt 253  lb 9.6 oz (115 kg)   LMP 03/01/2022   SpO2 97%   BMI 39.13 kg/m   Wt Readings from Last 3 Encounters:  03/10/22 253 lb 9.6 oz (115 kg)  02/02/22 248 lb 6.4 oz (112.7 kg)  02/16/20 250 lb (113.4 kg)    Physical Exam Vitals and nursing note reviewed.  Constitutional:      General: She is not in acute distress.    Appearance: Normal appearance.  HENT:     Head: Normocephalic and atraumatic.     Right Ear: Tympanic membrane, ear canal and external ear normal.     Left Ear: Tympanic membrane, ear canal and external ear normal.  Eyes:     Conjunctiva/sclera: Conjunctivae normal.  Cardiovascular:     Rate and Rhythm: Normal rate and regular rhythm.     Pulses: Normal pulses.     Heart sounds: Normal heart sounds.  Pulmonary:     Effort: Pulmonary effort is normal.     Breath sounds: Normal breath sounds.  Abdominal:     Palpations: Abdomen is soft.     Tenderness: There is no abdominal tenderness.  Musculoskeletal:        General: Normal range of motion.     Cervical back: Normal range of motion and neck supple.     Right lower leg: No edema.     Left lower leg: No edema.  Lymphadenopathy:     Cervical: No cervical adenopathy.  Skin:    General: Skin is warm and dry.  Neurological:     General: No focal deficit present.     Mental Status: She is alert and oriented to person, place, and time.     Cranial Nerves: No cranial nerve deficit.     Coordination: Coordination normal.     Gait: Gait normal.  Psychiatric:        Mood and Affect: Mood normal.        Behavior: Behavior normal.        Thought Content: Thought content normal.        Judgment: Judgment normal.     Results for orders placed or performed in visit on 03/10/22  CBC with Differential/Platelet  Result Value Ref Range   WBC 3.4 (L) 4.0 - 10.5 K/uL   RBC 4.27 3.87 - 5.11 Mil/uL   Hemoglobin 11.7 (L) 12.0 - 15.0 g/dL   HCT 35.3 (L) 36.0 -  46.0 %   MCV 82.6 78.0 - 100.0 fl   MCHC 33.1 30.0 - 36.0 g/dL    RDW 15.5 11.5 - 15.5 %   Platelets 223.0 150.0 - 400.0 K/uL   Neutrophils Relative % 55.3 43.0 - 77.0 %   Lymphocytes Relative 27.4 12.0 - 46.0 %   Monocytes Relative 14.2 (H) 3.0 - 12.0 %   Eosinophils Relative 2.7 0.0 - 5.0 %   Basophils Relative 0.4 0.0 - 3.0 %   Neutro Abs 1.9 1.4 - 7.7 K/uL   Lymphs Abs 0.9 0.7 - 4.0 K/uL   Monocytes Absolute 0.5 0.1 - 1.0 K/uL   Eosinophils Absolute 0.1 0.0 - 0.7 K/uL   Basophils Absolute 0.0 0.0 - 0.1 K/uL      Assessment & Plan:   Problem List Items Addressed This Visit       Other   Obesity (BMI 30-39.9)    BMI 39. Her hormones recently including [place], LH, testosterone, and thyroid were all normal. She is discouraged by this because she has been tracking calories and exercising routinely and is not losing any weight. Will place a referral to a nutritionist. Follow-up in 3 months.       Relevant Orders   Amb ref to Medical Nutrition Therapy-MNT   Anemia    Recent ferritin was 6 and hemoglobin 12.2. Will have her start iron tablet '65mg'$  every other day. Encouraged her to drink plenty of fluids and can start miralax daily as needed for constipation. Follow-up in 3 months.       Vitamin D deficiency    Vitamin D level was 14.8. She started vitamin D 50,000 units once a week. Will have her continue this for 12 weeks and then start vitamin D 2,000 unit supplement daily.       Other Visit Diagnoses     Routine general medical examination at a health care facility    -  Primary   Health maintenance reviewed and updated. Reviewed recent labs. Pap done, mammogram ordered. Follow-up in 1 year   Leukopenia, unspecified type       Recent WBC count was 2.4. Will repeat CBC with diff today.   Relevant Orders   CBC with Differential/Platelet (Completed)   Screening for cervical cancer       Pap with HPV done today   Relevant Orders   Cytology - PAP   Need for Td vaccine       Td booster given today   Relevant Orders   Td : Tetanus/diphtheria  >7yo Preservative  free (Completed)   Encounter for screening mammogram for malignant neoplasm of breast       Mammogram ordered today. Gave her a list of the mobile mammogram dates for the office.   Relevant Orders   MM 3D SCREEN BREAST BILATERAL        Follow up plan: Return in about 3 months (around 06/09/2022) for vit d, anemia, weight management.   LABORATORY TESTING:  - Pap smear: pap done  IMMUNIZATIONS:   - Tdap: Tetanus vaccination status reviewed: Td vaccination indicated and given today. - Influenza: Refused - Pneumovax: Not applicable - Prevnar: Not applicable - HPV: Not applicable - Zostavax vaccine: Not applicable  SCREENING: -Mammogram: Ordered today  - Colonoscopy: Not applicable  - Bone Density: Not applicable  -Hearing Test: Not applicable  -Spirometry: Not applicable   PATIENT COUNSELING:   Advised to take 1 mg of folate supplement per day if capable of pregnancy.   Sexuality:  Discussed sexually transmitted diseases, partner selection, use of condoms, avoidance of unintended pregnancy  and contraceptive alternatives.   Advised to avoid cigarette smoking.  I discussed with the patient that most people either abstain from alcohol or drink within safe limits (<=14/week and <=4 drinks/occasion for males, <=7/weeks and <= 3 drinks/occasion for females) and that the risk for alcohol disorders and other health effects rises proportionally with the number of drinks per week and how often a drinker exceeds daily limits.  Discussed cessation/primary prevention of drug use and availability of treatment for abuse.   Diet: Encouraged to adjust caloric intake to maintain  or achieve ideal body weight, to reduce intake of dietary saturated fat and total fat, to limit sodium intake by avoiding high sodium foods and not adding table salt, and to maintain adequate dietary potassium and calcium preferably from fresh fruits, vegetables, and low-fat dairy products.     stressed the importance of regular exercise  Injury prevention: Discussed safety belts, safety helmets, smoke detector, smoking near bedding or upholstery.   Dental health: Discussed importance of regular tooth brushing, flossing, and dental visits.    NEXT PREVENTATIVE PHYSICAL DUE IN 1 YEAR. Return in about 3 months (around 06/09/2022) for vit d, anemia, weight management.

## 2022-03-10 NOTE — Patient Instructions (Signed)
It was great to see you!  Start an iron supplement '65mg'$  every other day.   You can add on miralax every other day or every day if needed for constipation.   I have ordered a mammogram, you can call the breast center to schedule or call us if our times here work for you.   I will let you know the result of your pap.   Let's follow-up in 3 months, sooner if you have concerns.  If a referral was placed today, you will be contacted for an appointment. Please note that routine referrals can sometimes take up to 3-4 weeks to process. Please call our office if you haven't heard anything after this time frame.  Take care,  Vance Peper, NP

## 2022-03-10 NOTE — Assessment & Plan Note (Signed)
Recent ferritin was 6 and hemoglobin 12.2. Will have her start iron tablet '65mg'$  every other day. Encouraged her to drink plenty of fluids and can start miralax daily as needed for constipation. Follow-up in 3 months.

## 2022-03-10 NOTE — Assessment & Plan Note (Signed)
BMI 39. Her hormones recently including [place], LH, testosterone, and thyroid were all normal. She is discouraged by this because she has been tracking calories and exercising routinely and is not losing any weight. Will place a referral to a nutritionist. Follow-up in 3 months.

## 2022-03-10 NOTE — Assessment & Plan Note (Signed)
Vitamin D level was 14.8. She started vitamin D 50,000 units once a week. Will have her continue this for 12 weeks and then start vitamin D 2,000 unit supplement daily.

## 2022-03-14 LAB — CYTOLOGY - PAP
Comment: NEGATIVE
Diagnosis: NEGATIVE
High risk HPV: NEGATIVE

## 2022-03-15 MED ORDER — METRONIDAZOLE 0.75 % VA GEL: 1.0000 | Freq: Every day | VAGINAL | 0 refills | Status: DC

## 2022-03-15 NOTE — Addendum Note (Signed)
Addended by: Vance Peper A on: 03/15/2022 02:04 PM   Modules accepted: Orders

## 2022-04-18 ENCOUNTER — Encounter: Payer: Medicaid Other | Attending: Nurse Practitioner | Admitting: Skilled Nursing Facility1

## 2022-04-18 ENCOUNTER — Encounter: Payer: Self-pay | Admitting: Skilled Nursing Facility1

## 2022-04-18 DIAGNOSIS — E669 Obesity, unspecified: Secondary | ICD-10-CM | POA: Insufficient documentation

## 2022-04-18 NOTE — Progress Notes (Signed)
Medical Nutrition Therapy   Primary concerns today: to be heard about her body not being able to lose weight despite continuous efforts   Referral diagnosis: e66.9   NUTRITION ASSESSMENT    Clinical Medical Hx: chronic constipation Medications: see list Labs: WBC 3.4, hemoglobin 11.7, HCT 35.3, iron 37, vitamin D 14.85; all hormonal labs drawn WNL Notable Signs/Symptoms: constipation  Lifestyle & Dietary Hx  Pt states she has to take smooth move tea every night in order to have a bowel movement. Pt states she cannot lose weight with eating healthy. Pt states she should have lost more weight with another diet she did stating she only lost 8 pounds in 1 month.  Pt states she does not like the way she looks and wants to lose weight.  Pt states she does not believe that her labs are WNL pt believes it is definitely something wrong with her Hormones stating maybe the normal is not her normal. Pt states No matter what she eats it does not matter she is constipated and cannot have a bowel movement.    Estimated daily fluid intake: 64.6 oz Supplements: vitmain D Sleep: unknown Stress / self-care: unknown Current average weekly physical activity: states she goes for a walk every morning   24-Hr Dietary Recall First Meal: skipped or 2 servings almonds Snack: Second Meal: fruits almonds Snack:  Third Meal: salad or spaghetti Snack:  Beverages: water   NUTRITION INTERVENTION  Attempted Nutrition education (E-1) on the following topics:  Balanced meals and weight  Meal skipping and weight Appropriate caloric intake for weight Physical activity and weight    As pt is adamant there is nothing she can do with her diet or lifestyle to effect change with her weight and constipation dietitian advised she speak with her PCP about: pharmacotherapy, a referral to the endocrinologist and gastroenterologists.   Learning Style & Readiness for Change Teaching method utilized: Visual &  Auditory  Demonstrated degree of understanding via: Teach Back  Barriers to learning/adherence to lifestyle change:     MONITORING & EVALUATION Dietary intake, weekly physical activity

## 2022-04-27 ENCOUNTER — Telehealth: Payer: Self-pay | Admitting: Nurse Practitioner

## 2022-04-27 DIAGNOSIS — K5909 Other constipation: Secondary | ICD-10-CM

## 2022-04-27 NOTE — Telephone Encounter (Signed)
Pt went to the nutritionist appt that was scheduled it did not work. She would like a referral to a GI specialist.  Meghan Fernandez [phone]

## 2022-04-28 NOTE — Telephone Encounter (Signed)
LVM that a referral placed and that someone will be in contact to get appointment scheduled.

## 2022-04-29 ENCOUNTER — Encounter: Payer: Self-pay | Admitting: Gastroenterology

## 2022-05-23 ENCOUNTER — Ambulatory Visit
Admission: RE | Admit: 2022-05-23 | Discharge: 2022-05-23 | Disposition: A | Discharge status: Home / Self Care | Payer: Medicaid Other | Source: Ambulatory Visit | Attending: Nurse Practitioner | Admitting: Nurse Practitioner

## 2022-05-23 DIAGNOSIS — Z1231 Encounter for screening mammogram for malignant neoplasm of breast: Secondary | ICD-10-CM

## 2022-05-26 ENCOUNTER — Other Ambulatory Visit: Payer: Self-pay | Admitting: Nurse Practitioner

## 2022-05-26 DIAGNOSIS — R928 Other abnormal and inconclusive findings on diagnostic imaging of breast: Secondary | ICD-10-CM

## 2022-06-09 ENCOUNTER — Ambulatory Visit: Payer: Medicaid Other | Admitting: Nurse Practitioner

## 2022-06-13 ENCOUNTER — Other Ambulatory Visit: Payer: Self-pay | Admitting: Nurse Practitioner

## 2022-06-13 ENCOUNTER — Ambulatory Visit
Admission: RE | Admit: 2022-06-13 | Discharge: 2022-06-13 | Disposition: A | Discharge status: Home / Self Care | Payer: Medicaid Other | Source: Ambulatory Visit | Attending: Nurse Practitioner | Admitting: Nurse Practitioner

## 2022-06-13 DIAGNOSIS — R928 Other abnormal and inconclusive findings on diagnostic imaging of breast: Secondary | ICD-10-CM

## 2022-06-13 DIAGNOSIS — R921 Mammographic calcification found on diagnostic imaging of breast: Secondary | ICD-10-CM | POA: Diagnosis not present

## 2022-06-13 DIAGNOSIS — N6310 Unspecified lump in the right breast, unspecified quadrant: Secondary | ICD-10-CM

## 2022-06-20 ENCOUNTER — Ambulatory Visit: Payer: Medicaid Other | Admitting: Gastroenterology

## 2022-06-20 ENCOUNTER — Encounter: Payer: Self-pay | Admitting: Gastroenterology

## 2022-06-20 VITALS — BP 110/78 | HR 54 | Ht 68.0 in | Wt 251.0 lb

## 2022-06-20 DIAGNOSIS — K5900 Constipation, unspecified: Secondary | ICD-10-CM | POA: Diagnosis not present

## 2022-06-20 NOTE — Patient Instructions (Signed)
_______________________________________________________  If your blood pressure at your visit was 140/90 or greater, please contact your primary care physician to follow up on this.  _______________________________________________________  If you are age 42 or older, your body mass index should be between 23-30. Your Body mass index is 38.16 kg/m. If this is out of the aforementioned range listed, please consider follow up with your Primary Care Provider.  If you are age 56 or younger, your body mass index should be between 19-25. Your Body mass index is 38.16 kg/m. If this is out of the aformentioned range listed, please consider follow up with your Primary Care Provider.    We have sent a referral to Rehabilitation and they will contact you to make an appointment.  Please purchase Metamucil over the counter. Take as directed.   Continue smooth move tea.  The [facility] providers would like to encourage you to use [facility] to communicate with providers for non-urgent requests or questions.  Due to long hold times on the telephone, sending your provider a message by [facility] may be a faster and more efficient way to get a response.  Please allow 48 business hours for a response.  Please remember that this is for non-urgent requests.   It was a pleasure to see you today!  Thank you for trusting me with your gastrointestinal care!

## 2022-06-20 NOTE — Progress Notes (Signed)
HPI : Meghan Fernandez is a very pleasant 42 year old female with history of iron deficiency anemia secondary to uterine fibroids who is referred to [place] by Rodman Pickle, NP for further evaluation and treatment of chronic constipation the patient states that she has had problems with constipation for many years.  Her symptoms have been stable overall and have not significantly worsened. She reports infrequent urges to defecate, accompanied by significant straining and difficulty evacuating stool.  She is reliant on medications to help her go to the bathroom.  The longest she has gone without a bowel movement is a week, but she states she would have gone longer if she did not take medications.  Currently, she has been taking Smooth Move laxative tea most days of the week.  As long as she does this, she will have a bowel movement most every day.  Her stools are soft when she takes a laxative, but will be very hard to pass if she does not take a laxative.  She has seen blood on the toilet paper on occasion when she passes a hard stool.  She recalls taking Metamucil many years ago, but does not think it helped.  She thinks she only took it for few days.   She denies any problems with significant abdominal pain, but does feel bloated and uncomfortable when she goes days without a bowel movement.  In addition to her constipation, she also describes foul-smelling gas, loud digestive noises, nausea, fatigue and malaise.  She says that she eats a fairly healthy diet but does not keep track of fiber intake.  She does drink plenty of water and exercises regularly.    She has iron deficiency anemia secondary to heavy menses attributed to uterine fibroids. Her primary care provider is starting her on oral iron.   Past Medical History:  Diagnosis Date   Allergy    Anemia    Asthma    childhood no inhaler now   External hemorrhoids with complication    Fibroids    History of asthma    childhood   History of  chicken pox      History reviewed. No pertinent surgical history. Family History  Problem Relation Age of Onset   Hyperlipidemia Mother    Hypertension Mother    Peripheral vascular disease Mother    Cancer Mother        thyroid   Diabetes Mother    Hyperlipidemia Father    Hypertension Father    Cancer Father        prostate   Diabetes Sister    Breast cancer Maternal Aunt    Diabetes Brother    Social History   Tobacco Use   Smoking status: Never    Passive exposure: Never   Smokeless tobacco: Never  Vaping Use   Vaping Use: Never used  Substance Use Topics   Alcohol use: No   Drug use: No   Current Outpatient Medications  Medication Sig Dispense Refill   Cholecalciferol (VITAMIN D-3) 25 MCG (1000 UT) CAPS Take 1 capsule by mouth daily.     No current facility-administered medications for this visit.   No Known Allergies   Review of Systems: All systems reviewed and negative except where noted in HPI.    MM Digital Diagnostic Unilat L  Result Date: 06/13/2022 CLINICAL DATA:  42 year old female for further evaluation of LEFT breast calcifications and possible RIGHT breast mass on baseline screening mammogram. EXAM: DIGITAL DIAGNOSTIC UNILATERAL LEFT MAMMOGRAM WITH CAD; ULTRASOUND  RIGHT BREAST LIMITED TECHNIQUE: Left digital diagnostic mammography was performed. ; Targeted ultrasound examination of the right breast was performed COMPARISON:  Previous exam(s). ACR Breast Density Category b: There are scattered areas of fibroglandular density. FINDINGS: Full field and magnification views of the LEFT breast demonstrate a 0.1 cm group of LOWER INNER LEFT breast calcifications, which are dermal/benign when evaluating prior tomo views. Targeted ultrasound is performed, showing a 0.6 x 0.3 x 0.4 cm circumscribed oval hypoechoic parallel mass at the 8 o'clock position of the RIGHT breast 8 cm from the nipple, a good correlate to the mammographic finding. An equivocal fatty  hilum is noted. No other sonographic abnormalities in this area noted. IMPRESSION: 1. Likely benign 0.6 cm mass in the LOWER OUTER RIGHT breast, a good correlate to the mammographic finding. Options of short-term follow-up and tissue sampling were presented. She desires short-term follow-up. 2. Benign LEFT breast dermal calcifications. RECOMMENDATION: RIGHT diagnostic mammogram and RIGHT breast ultrasound in 6 months. I have discussed the findings and recommendations with the patient. If applicable, a reminder letter will be sent to the patient regarding the next appointment. BI-RADS CATEGORY  3: Probably benign. Electronically Signed   By: Harmon Pier M.D.   On: 06/13/2022 09:29  [place] LIMITED ULTRASOUND INCLUDING AXILLA RIGHT BREAST  Result Date: 06/13/2022 CLINICAL DATA:  42 year old female for further evaluation of LEFT breast calcifications and possible RIGHT breast mass on baseline screening mammogram. EXAM: DIGITAL DIAGNOSTIC UNILATERAL LEFT MAMMOGRAM WITH CAD; ULTRASOUND RIGHT BREAST LIMITED TECHNIQUE: Left digital diagnostic mammography was performed. ; Targeted ultrasound examination of the right breast was performed COMPARISON:  Previous exam(s). ACR Breast Density Category b: There are scattered areas of fibroglandular density. FINDINGS: Full field and magnification views of the LEFT breast demonstrate a 0.1 cm group of LOWER INNER LEFT breast calcifications, which are dermal/benign when evaluating prior tomo views. Targeted ultrasound is performed, showing a 0.6 x 0.3 x 0.4 cm circumscribed oval hypoechoic parallel mass at the 8 o'clock position of the RIGHT breast 8 cm from the nipple, a good correlate to the mammographic finding. An equivocal fatty hilum is noted. No other sonographic abnormalities in this area noted. IMPRESSION: 1. Likely benign 0.6 cm mass in the LOWER OUTER RIGHT breast, a good correlate to the mammographic finding. Options of short-term follow-up and tissue sampling were  presented. She desires short-term follow-up. 2. Benign LEFT breast dermal calcifications. RECOMMENDATION: RIGHT diagnostic mammogram and RIGHT breast ultrasound in 6 months. I have discussed the findings and recommendations with the patient. If applicable, a reminder letter will be sent to the patient regarding the next appointment. BI-RADS CATEGORY  3: Probably benign. Electronically Signed   By: Harmon Pier M.D.   On: 06/13/2022 09:29  MM 3D SCREEN BREAST BILATERAL  Result Date: 05/25/2022 CLINICAL DATA:  Screening. EXAM: DIGITAL SCREENING BILATERAL MAMMOGRAM WITH TOMOSYNTHESIS AND CAD TECHNIQUE: Bilateral screening digital craniocaudal and mediolateral oblique mammograms were obtained. Bilateral screening digital breast tomosynthesis was performed. The images were evaluated with computer-aided detection. COMPARISON:  None available. ACR Breast Density Category b: There are scattered areas of fibroglandular density. FINDINGS: In the right breast , a possible mass requires further evaluation. This possible mass is seen within the lower posterior RIGHT breast, cc slice 55 MLO slice 57. In the left breast , calcifications requires further evaluation. IMPRESSION: Further evaluation is suggested for possible mass in the right breast. Further evaluation is suggested for calcifications in the left breast. RECOMMENDATION: 1. RIGHT breast ultrasound. 2. Diagnostic mammogram  and possibly ultrasound of the LEFT breast. (Code:[id number]-B-37M) The patient will be contacted regarding the findings, and additional imaging will be scheduled. BI-RADS CATEGORY  0: Incomplete: Need additional imaging evaluation. Electronically Signed   By: Bary Richard M.D.   On: 05/25/2022 07:31    Physical Exam: BP 110/78   Pulse (!) 54   Ht  (1.727 m)   Wt 251 lb (113.9 kg)   BMI 38.16 kg/m  Constitutional: Pleasant,well-developed, African-American female in no acute distress. HEENT: Normocephalic and atraumatic. Conjunctivae are  normal. No scleral icterus. Neck supple.  Cardiovascular: Normal rate, regular rhythm.  Pulmonary/chest: Effort normal and breath sounds normal. No wheezing, rales or rhonchi. Abdominal: Soft, nondistended, nontender. Bowel sounds active throughout. There are no masses palpable. No hepatomegaly. Extremities: no edema Lymphadenopathy: No cervical adenopathy noted. Neurological: Alert and oriented to person place and time. Skin: Skin is warm and dry. No rashes noted. Psychiatric: Normal mood and affect. Behavior is normal.  CBC    Component Value Date/Time   WBC 3.4 (L) 03/10/2022 0906   RBC 4.27 03/10/2022 0906   HGB 11.7 (L) 03/10/2022 0906   HCT 35.3 (L) 03/10/2022 0906   PLT 223.0 03/10/2022 0906   MCV 82.6 03/10/2022 0906   MCH 27.3 06/01/2011 0600   MCHC 33.1 03/10/2022 0906   RDW 15.5 03/10/2022 0906   LYMPHSABS 0.9 03/10/2022 0906   MONOABS 0.5 03/10/2022 0906   EOSABS 0.1 03/10/2022 0906   BASOSABS 0.0 03/10/2022 0906    CMP     Component Value Date/Time   NA 140 02/18/2022 1001   K 3.9 02/18/2022 1001   CL 107 02/18/2022 1001   CO2 26 02/18/2022 1001   GLUCOSE 82 02/18/2022 1001   BUN 11 02/18/2022 1001   CREATININE 0.85 02/18/2022 1001   CALCIUM 9.1 02/18/2022 1001   PROT 7.0 02/18/2022 1001   ALBUMIN 3.8 02/18/2022 1001   AST 16 02/18/2022 1001   ALT 10 02/18/2022 1001   ALKPHOS 52 02/18/2022 1001   BILITOT 0.4 02/18/2022 1001     ASSESSMENT AND PLAN: 42 year old female with longstanding constipation, manifested by infrequent bowel movements and significant straining with defecation unless she takes laxatives.  The patient would like to not have to take medications to help with bowel movement.  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  As the patient desires to be able to have bowel movements without taking laxatives, she was interested in meeting with  the pelvic floor physical therapist for assessment. In the meantime, I recommend that she try Metamucil again.  Recommend she take it every day for least a month.  She can continue to take the laxative tea nightly, but I suggested she try to decrease frequency to every other day once she starts the Metamucil.  Constipation - Metamucil daily - Pelvic floor PT (patient would like to not take meds) - Continue Smooth Move tea; advised to try to decrease to QOD  Jannelly Bergren E. Tomasa Rand, MD [facility]   Gerre Scull, NP

## 2022-08-31 ENCOUNTER — Ambulatory Visit: Payer: Medicaid Other | Admitting: Nurse Practitioner

## 2022-08-31 ENCOUNTER — Other Ambulatory Visit (HOSPITAL_COMMUNITY)
Admission: RE | Admit: 2022-08-31 | Discharge: 2022-08-31 | Disposition: A | Discharge status: Home / Self Care | Payer: Medicaid Other | Source: Ambulatory Visit | Attending: Nurse Practitioner | Admitting: Nurse Practitioner

## 2022-08-31 ENCOUNTER — Encounter: Payer: Self-pay | Admitting: Nurse Practitioner

## 2022-08-31 VITALS — BP 120/72 | HR 52 | Temp 97.5°F | Ht 68.0 in | Wt 275.0 lb

## 2022-08-31 DIAGNOSIS — M79672 Pain in left foot: Secondary | ICD-10-CM

## 2022-08-31 DIAGNOSIS — N898 Other specified noninflammatory disorders of vagina: Secondary | ICD-10-CM | POA: Insufficient documentation

## 2022-08-31 DIAGNOSIS — E559 Vitamin D deficiency, unspecified: Secondary | ICD-10-CM | POA: Diagnosis not present

## 2022-08-31 DIAGNOSIS — D500 Iron deficiency anemia secondary to blood loss (chronic): Secondary | ICD-10-CM | POA: Diagnosis not present

## 2022-08-31 LAB — CBC WITH DIFFERENTIAL/PLATELET
Basophils Absolute: 0 10*3/uL (ref 0.0–0.1)
Basophils Relative: 0.5 % (ref 0.0–3.0)
Eosinophils Absolute: 0.1 10*3/uL (ref 0.0–0.7)
Eosinophils Relative: 2.5 % (ref 0.0–5.0)
HCT: 35.6 % — ABNORMAL LOW (ref 36.0–46.0)
Hemoglobin: 11.5 g/dL — ABNORMAL LOW (ref 12.0–15.0)
Lymphocytes Relative: 38.6 % (ref 12.0–46.0)
Lymphs Abs: 1.2 10*3/uL (ref 0.7–4.0)
MCHC: 32.3 g/dL (ref 30.0–36.0)
MCV: 86.2 fl (ref 78.0–100.0)
Monocytes Absolute: 0.4 10*3/uL (ref 0.1–1.0)
Monocytes Relative: 14.9 % — ABNORMAL HIGH (ref 3.0–12.0)
Neutro Abs: 1.3 10*3/uL — ABNORMAL LOW (ref 1.4–7.7)
Neutrophils Relative %: 43.5 % (ref 43.0–77.0)
Platelets: 227 10*3/uL (ref 150.0–400.0)
RBC: 4.13 Mil/uL (ref 3.87–5.11)
RDW: 16 % — ABNORMAL HIGH (ref 11.5–15.5)
WBC: 3 10*3/uL — ABNORMAL LOW (ref 4.0–10.5)

## 2022-08-31 LAB — VITAMIN D 25 HYDROXY (VIT D DEFICIENCY, FRACTURES): VITD: 25.34 ng/mL — ABNORMAL LOW (ref 30.00–100.00)

## 2022-08-31 NOTE — Progress Notes (Unsigned)
Established Patient Office Visit  Subjective   Patient ID: AUBRII BIRKY, female    DOB: 01-Feb-1981  Age: 42 y.o. MRN: [id number]  Chief Complaint  Patient presents with   Anemia   Weight Check    HPI  Meghan Fernandez is here to follow-up on prediabetes, anemia, and weight management.   She states that she has been doing well.  She saw a nutritionist but did not find this too beneficial.  She has started making small adjustments in her nutrition and increasing the intensity of her exercise.  She notes that she has gained weight from her last visit, however it has gone down in the last few weeks.  She did note though that she had started having left foot pain after doing jump rope about 6 weeks ago.  She does not notice any swelling.  Since then she has not been doing any jumping activity.  She did use some ice on her foot.  It is not hurting currently.  She has been taking her iron and vitamin D supplement regularly.  She does have some constipation, however she is taking smooth move daily.  She is drinking plenty of water. She denies chest pain and shortness of breath.   She has been experiencing some vaginal itching for the last few weeks.  She states that she has urinary urgency with incontinence and wears a pad all the time.  She tries to changes regularly however is not sure if this is contributing to the BV that she had last time.  She denies vaginal discharge and dysuria.     ROS See pertinent positives and negatives per HPI.    Objective:     BP 120/72   Pulse (!) 52   Temp (!) 97.5 F (36.4 C) (Temporal)   Ht 5\' 8"  (1.727 m)   Wt 275 lb (124.7 kg)   LMP 08/25/2022   SpO2 98%   BMI 41.81 kg/m  BP Readings from Last 3 Encounters:  08/31/22 120/72  06/20/22 110/78  03/10/22 110/70   Wt Readings from Last 3 Encounters:  08/31/22 275 lb (124.7 kg)  06/20/22 251 lb (113.9 kg)  03/10/22 253 lb 9.6 oz (115 kg)    Physical Exam Vitals and nursing note reviewed.   Constitutional:      General: She is not in acute distress.    Appearance: Normal appearance.  HENT:     Head: Normocephalic.  Eyes:     Conjunctiva/sclera: Conjunctivae normal.  Cardiovascular:     Rate and Rhythm: Normal rate and regular rhythm.     Pulses: Normal pulses.     Heart sounds: Normal heart sounds.  Pulmonary:     Effort: Pulmonary effort is normal.     Breath sounds: Normal breath sounds.  Musculoskeletal:     Cervical back: Normal range of motion.  Skin:    General: Skin is warm.  Neurological:     General: No focal deficit present.     Mental Status: She is alert and oriented to person, place, and time.  Psychiatric:        Mood and Affect: Mood normal.        Behavior: Behavior normal.        Thought Content: Thought content normal.        Judgment: Judgment normal.     The 10-year ASCVD risk score (Arnett DK, et al., 2019) is: 0.3%    Assessment & Plan:   Problem List Items Addressed  This Visit       Other   Anemia - Primary    Continue iron supplement every other day and will check CBC and iron panel and adjust regimen based on results.      Relevant Medications   ferrous sulfate 325 (65 FE) MG EC tablet   Other Relevant Orders   CBC with Differential/Platelet (Completed)   Iron, TIBC and Ferritin Panel (Completed)   Vitamin D deficiency    Continue vitamin D supplement 2000 units daily.  Check vitamin D level and adjust regimen based on results.      Relevant Orders   VITAMIN D 25 Hydroxy (Vit-D Deficiency, Fractures) (Completed)   Morbid obesity (HCC)    BMI 41.8.  Discussed nutrition and exercise.      Other Visit Diagnoses     Vaginal itching       Check swab for BV and yeast, treat based on results. Encourage to keep peri area clean and dry and to change pad regularly.   Relevant Orders   Cervicovaginal ancillary only   Left foot pain       Started 6 weeks ago with jump rope. Continue without jumping for 2 more weeks, then  ease back into it. Can use ice/ibuprofen prn.       Return in about 6 months (around 03/03/2023) for CPE.    Gerre Scull, NP

## 2022-08-31 NOTE — Patient Instructions (Signed)
It was great to see you!  We are checking your labs today and will let you know the results via mychart/phone.   You can try compound W or duct tape to the wart on your left thumb  Let's follow-up in 6 months, sooner if you have concerns.  If a referral was placed today, you will be contacted for an appointment. Please note that routine referrals can sometimes take up to 3-4 weeks to process. Please call our office if you haven't heard anything after this time frame.  Take care,  Rodman Pickle, NP

## 2022-09-01 LAB — IRON,TIBC AND FERRITIN PANEL
%SAT: 8 % (calc) — ABNORMAL LOW (ref 16–45)
Ferritin: 9 ng/mL — ABNORMAL LOW (ref 16–232)
Iron: 26 ug/dL — ABNORMAL LOW (ref 40–190)
TIBC: 336 mcg/dL (calc) (ref 250–450)

## 2022-09-01 NOTE — Assessment & Plan Note (Signed)
Continue vitamin D supplement 2000 units daily.  Check vitamin D level and adjust regimen based on results.

## 2022-09-01 NOTE — Assessment & Plan Note (Signed)
BMI 41.8.  Discussed nutrition and exercise.

## 2022-09-01 NOTE — Assessment & Plan Note (Signed)
Continue iron supplement every other day and will check CBC and iron panel and adjust regimen based on results.

## 2022-09-02 LAB — CERVICOVAGINAL ANCILLARY ONLY
Bacterial Vaginitis (gardnerella): POSITIVE — AB
Candida Glabrata: NEGATIVE
Candida Vaginitis: NEGATIVE
Comment: NEGATIVE
Comment: NEGATIVE
Comment: NEGATIVE
Comment: NEGATIVE
Trichomonas: NEGATIVE

## 2022-09-02 MED ORDER — METRONIDAZOLE 0.75 % VA GEL: 1.0000 | Freq: Every day | VAGINAL | 0 refills | Status: DC

## 2022-09-02 NOTE — Addendum Note (Signed)
Addended by: Rodman Pickle A on: 09/02/2022 05:10 PM   Modules accepted: Orders

## 2022-12-23 ENCOUNTER — Other Ambulatory Visit (HOSPITAL_COMMUNITY)
Admission: RE | Admit: 2022-12-23 | Discharge: 2022-12-23 | Disposition: A | Discharge status: Home / Self Care | Payer: Medicaid Other | Source: Ambulatory Visit | Attending: Nurse Practitioner | Admitting: Nurse Practitioner

## 2022-12-23 ENCOUNTER — Encounter: Payer: Self-pay | Admitting: Nurse Practitioner

## 2022-12-23 ENCOUNTER — Ambulatory Visit: Payer: Medicaid Other | Admitting: Nurse Practitioner

## 2022-12-23 VITALS — BP 124/78 | HR 48 | Temp 97.2°F | Ht 68.0 in | Wt 229.0 lb

## 2022-12-23 DIAGNOSIS — N898 Other specified noninflammatory disorders of vagina: Secondary | ICD-10-CM

## 2022-12-23 NOTE — Patient Instructions (Signed)
It was great to see you!  Use the monistat tonight.  We will let you know what the swab shows.   Let's follow-up if symptoms worsen or any concerns.   Take care,  Rodman Pickle, NP

## 2022-12-23 NOTE — Progress Notes (Signed)
Acute Office Visit  Subjective:     Patient ID: Meghan Fernandez, female    DOB: March 06, 1980, 42 y.o.   MRN: [id number]  Chief Complaint  Patient presents with   Vaginal Infection    Since Saturday with odorless discharge and soreness/bumps    HPI Patient is in today for vaginal discharge and soreness with bumps for the past 4 days.  Discussed the use of AI scribe software for clinical note transcription with the patient, who gave verbal consent to proceed.  History of Present Illness   The patient presents with a recurrent vaginal infection, characterized by discharge, itching, bumps, and swelling. She noticed the symptoms a few days ago, with the appearance of 'little white bubbles.' She has been following previous advice to prevent infection, including limiting the use of pads to when she is at work and immediately changing out of sweaty clothes after working out. Despite these measures, she continues to experience infections. She reports recent sexual activity, which she acknowledges may have contributed to the current infection. However, she also notes that she had symptoms prior to this activity. She has had previous infections, but this one is different, with more severe symptoms including clumpy discharge and pain. She has been using monistat 3 over the counter.     ROS See pertinent positives and negatives per HPI.     Objective:    BP 124/78 (BP Location: Left Arm)   Pulse (!) 48   Temp (!) 97.2 F (36.2 C)   Ht 5\' 8"  (1.727 m)   Wt 229 lb (103.9 kg)   LMP 12/06/2022 (Approximate)   SpO2 100%   Breastfeeding No   BMI 34.82 kg/m    Physical Exam Vitals and nursing note reviewed. Exam conducted with a chaperone present.  Constitutional:      General: She is not in acute distress.    Appearance: Normal appearance.  HENT:     Head: Normocephalic.  Eyes:     Conjunctiva/sclera: Conjunctivae normal.  Pulmonary:     Effort: Pulmonary effort is normal.   Genitourinary:    General: Normal vulva.     Exam position: Lithotomy position.     Labia:        Right: No rash, tenderness or lesion.        Left: No rash, tenderness or lesion.      Vagina: Normal.     Comments: Some white discharge noted, could be from monistat Musculoskeletal:     Cervical back: Normal range of motion.  Skin:    General: Skin is warm.  Neurological:     General: No focal deficit present.     Mental Status: She is alert and oriented to person, place, and time.  Psychiatric:        Mood and Affect: Mood normal.        Behavior: Behavior normal.        Thought Content: Thought content normal.        Judgment: Judgment normal.      Assessment & Plan:   Problem List Items Addressed This Visit   None Visit Diagnoses     Vaginal discharge    -  Primary   Check swab for Gonorrhea, chlamydia, trich, BV and yeast. Finish up monistat 3 tonight. Treat based on results.   Relevant Orders   Cervicovaginal ancillary only       No orders of the defined types were placed in this encounter.   Return if symptoms  worsen or fail to improve.  Gerre Scull, NP

## 2022-12-27 LAB — CERVICOVAGINAL ANCILLARY ONLY
Bacterial Vaginitis (gardnerella): NEGATIVE
Candida Glabrata: NEGATIVE
Candida Vaginitis: NEGATIVE
Chlamydia: NEGATIVE
Comment: NEGATIVE
Comment: NEGATIVE
Comment: NEGATIVE
Comment: NEGATIVE
Comment: NEGATIVE
Comment: NORMAL
Neisseria Gonorrhea: NEGATIVE
Trichomonas: NEGATIVE

## 2023-02-20 ENCOUNTER — Telehealth: Payer: Self-pay

## 2023-02-20 NOTE — Telephone Encounter (Signed)
Please advise in Lauren's absence.

## 2023-02-20 NOTE — Telephone Encounter (Signed)
Copied from CRM [id number]. Topic: Clinical - Medical Advice >> Feb 20, 2023  2:37 PM Leavy Cella D wrote: Reason for CRM: Patient stated Friday 12/21 she was standing in her kitchen and she felt an inflammation of her hemorrhoids.Patient stated it is the size of a quarter . Patient has been putting cream on it that contains lidocaine that is helping with the pain . Patient wants to know if she should wait for them to go down or be seen . I tried scheduling an appointment but the next availability would be January 13th.

## 2023-02-23 ENCOUNTER — Encounter: Payer: Self-pay | Admitting: Family

## 2023-02-23 ENCOUNTER — Ambulatory Visit: Payer: Medicaid Other | Admitting: Family

## 2023-02-23 VITALS — BP 122/82 | HR 63 | Temp 97.6°F | Ht 68.0 in | Wt 236.0 lb

## 2023-02-23 DIAGNOSIS — K644 Residual hemorrhoidal skin tags: Secondary | ICD-10-CM

## 2023-02-23 MED ORDER — HYDROCORTISONE ACETATE 25 MG RE SUPP: 25.0000 mg | Freq: Two times a day (BID) | RECTAL | 0 refills | Status: DC

## 2023-02-23 NOTE — Progress Notes (Signed)
   Acute Office Visit  Subjective:     Patient ID: Meghan Fernandez, female    DOB: 1980-12-31, 42 y.o.   MRN: [id number]  Chief Complaint  Patient presents with  . Hemorrhoids    Swollen since Friday, referral for a surgeon    HPI Patient is in today with complaints of external hemorrhoids that have been present for 6 days.  Patient reports that she has been using over-the-counter Preparation H and lidocaine that have helped her to deal with her symptoms.  She is better today.  Did have some bleeding from one of the hemorrhoids.  In the past, she has been referred to GI for hemorrhoidectomy but unfortunately lost her benefits and was unable to go through with the surgery.  However, she would like to reconsider at this time.  She has 1 hemorrhoid that is still inflamed but improved.  Denies any constipation or frequent stools.  No active bleeding.  Review of Systems  Respiratory: Negative.    Cardiovascular: Negative.   Gastrointestinal:        External hemorrhoids-bleeding, resolved  All other systems reviewed and are negative.      Objective:    BP 122/82 (BP Location: Left Arm, Patient Position: Sitting, Cuff Size: Normal)   Pulse 63   Temp 97.6 F (36.4 C)   Ht 5\' 8"  (1.727 m)   Wt 236 lb (107 kg)   LMP 02/22/2023 (Exact Date)   SpO2 99%   BMI 35.88 kg/m    Physical Exam Vitals reviewed.  Constitutional:      Appearance: Normal appearance. She is obese.  Cardiovascular:     Rate and Rhythm: Normal rate and regular rhythm.     Pulses: Normal pulses.     Heart sounds: Normal heart sounds.  Pulmonary:     Effort: Pulmonary effort is normal.     Breath sounds: Normal breath sounds.  Abdominal:     General: Abdomen is flat. Bowel sounds are normal.     Palpations: Abdomen is soft.  Genitourinary:      Comments: Multiple hemorrhoids noted external to the rectum.  1 in particular is inflamed but nonthrombosed.  Mild tenderness. Neurological:     Mental Status: She  is alert.   No results found for any visits on 02/23/23.      Assessment & Plan:   Problem List Items Addressed This Visit   None Visit Diagnoses       External hemorrhoid    -  Primary   Relevant Orders   Ambulatory referral to Gastroenterology       Meds ordered this encounter  Medications  . hydrocortisone (ANUSOL-HC) 25 MG suppository    Sig: Place 1 suppository (25 mg total) rectally 2 (two) times daily.    Dispense:  12 suppository    Refill:  0   Referral to gastroenterology placed today.  Patient to call the office if symptoms worsen.  Follow-up with GI as scheduled No follow-ups on file.  Eulis Foster, FNP

## 2023-03-02 ENCOUNTER — Ambulatory Visit
Admission: RE | Admit: 2023-03-02 | Discharge: 2023-03-02 | Disposition: A | Discharge status: Home / Self Care | Payer: Medicaid Other | Source: Ambulatory Visit | Attending: Nurse Practitioner | Admitting: Nurse Practitioner

## 2023-03-02 ENCOUNTER — Other Ambulatory Visit: Payer: Self-pay | Admitting: Nurse Practitioner

## 2023-03-02 DIAGNOSIS — R928 Other abnormal and inconclusive findings on diagnostic imaging of breast: Secondary | ICD-10-CM

## 2023-03-02 DIAGNOSIS — N6310 Unspecified lump in the right breast, unspecified quadrant: Secondary | ICD-10-CM

## 2023-03-02 DIAGNOSIS — N6313 Unspecified lump in the right breast, lower outer quadrant: Secondary | ICD-10-CM | POA: Diagnosis not present

## 2023-03-03 ENCOUNTER — Other Ambulatory Visit: Payer: Medicaid Other

## 2023-03-14 ENCOUNTER — Other Ambulatory Visit (HOSPITAL_COMMUNITY)
Admission: RE | Admit: 2023-03-14 | Discharge: 2023-03-14 | Disposition: A | Discharge status: Home / Self Care | Payer: Medicaid Other | Source: Ambulatory Visit | Attending: Nurse Practitioner | Admitting: Nurse Practitioner

## 2023-03-14 ENCOUNTER — Encounter: Payer: Self-pay | Admitting: Nurse Practitioner

## 2023-03-14 ENCOUNTER — Ambulatory Visit (INDEPENDENT_AMBULATORY_CARE_PROVIDER_SITE_OTHER): Payer: Medicaid Other | Admitting: Nurse Practitioner

## 2023-03-14 ENCOUNTER — Telehealth: Payer: Self-pay | Admitting: Nurse Practitioner

## 2023-03-14 VITALS — BP 118/80 | HR 80 | Temp 98.0°F | Ht 68.0 in | Wt 236.4 lb

## 2023-03-14 DIAGNOSIS — K5909 Other constipation: Secondary | ICD-10-CM

## 2023-03-14 DIAGNOSIS — N898 Other specified noninflammatory disorders of vagina: Secondary | ICD-10-CM

## 2023-03-14 DIAGNOSIS — N939 Abnormal uterine and vaginal bleeding, unspecified: Secondary | ICD-10-CM | POA: Diagnosis not present

## 2023-03-14 DIAGNOSIS — Z1159 Encounter for screening for other viral diseases: Secondary | ICD-10-CM

## 2023-03-14 DIAGNOSIS — D500 Iron deficiency anemia secondary to blood loss (chronic): Secondary | ICD-10-CM | POA: Diagnosis not present

## 2023-03-14 DIAGNOSIS — E7800 Pure hypercholesterolemia, unspecified: Secondary | ICD-10-CM | POA: Diagnosis not present

## 2023-03-14 DIAGNOSIS — Z0001 Encounter for general adult medical examination with abnormal findings: Secondary | ICD-10-CM

## 2023-03-14 DIAGNOSIS — E559 Vitamin D deficiency, unspecified: Secondary | ICD-10-CM | POA: Diagnosis not present

## 2023-03-14 DIAGNOSIS — E669 Obesity, unspecified: Secondary | ICD-10-CM | POA: Diagnosis not present

## 2023-03-14 LAB — CBC WITH DIFFERENTIAL/PLATELET
Basophils Absolute: 0 10*3/uL (ref 0.0–0.1)
Basophils Relative: 0.5 % (ref 0.0–3.0)
Eosinophils Absolute: 0.1 10*3/uL (ref 0.0–0.7)
Eosinophils Relative: 2.8 % (ref 0.0–5.0)
HCT: 38.8 % (ref 36.0–46.0)
Hemoglobin: 12.5 g/dL (ref 12.0–15.0)
Lymphocytes Relative: 25.3 % (ref 12.0–46.0)
Lymphs Abs: 0.8 10*3/uL (ref 0.7–4.0)
MCHC: 32.2 g/dL (ref 30.0–36.0)
MCV: 87.9 fL (ref 78.0–100.0)
Monocytes Absolute: 0.5 10*3/uL (ref 0.1–1.0)
Monocytes Relative: 16.2 % — ABNORMAL HIGH (ref 3.0–12.0)
Neutro Abs: 1.8 10*3/uL (ref 1.4–7.7)
Neutrophils Relative %: 55.2 % (ref 43.0–77.0)
Platelets: 182 10*3/uL (ref 150.0–400.0)
RBC: 4.41 Mil/uL (ref 3.87–5.11)
RDW: 15.2 % (ref 11.5–15.5)
WBC: 3.2 10*3/uL — ABNORMAL LOW (ref 4.0–10.5)

## 2023-03-14 LAB — COMPREHENSIVE METABOLIC PANEL
ALT: 13 U/L (ref 0–35)
AST: 20 U/L (ref 0–37)
Albumin: 3.8 g/dL (ref 3.5–5.2)
Alkaline Phosphatase: 44 U/L (ref 39–117)
BUN: 20 mg/dL (ref 6–23)
CO2: 29 meq/L (ref 19–32)
Calcium: 9.2 mg/dL (ref 8.4–10.5)
Chloride: 104 meq/L (ref 96–112)
Creatinine, Ser: 1.05 mg/dL (ref 0.40–1.20)
GFR: 65.53 mL/min (ref >60.00)
Glucose, Bld: 82 mg/dL (ref 70–99)
Potassium: 4.7 meq/L (ref 3.5–5.1)
Sodium: 138 meq/L (ref 135–145)
Total Bilirubin: 0.4 mg/dL (ref 0.2–1.2)
Total Protein: 6.8 g/dL (ref 6.0–8.3)

## 2023-03-14 LAB — TESTOSTERONE: Testosterone: 35.97 ng/dL (ref 15.00–40.00)

## 2023-03-14 LAB — LIPID PANEL
Cholesterol: 167 mg/dL (ref 0–200)
HDL: 61.5 mg/dL (ref >39.00)
LDL Cholesterol: 96 mg/dL (ref 0–99)
NonHDL: 105.63
Total CHOL/HDL Ratio: 3
Triglycerides: 46 mg/dL (ref 0.0–149.0)
VLDL: 9.2 mg/dL (ref 0.0–40.0)

## 2023-03-14 LAB — TSH: TSH: 1.37 u[IU]/mL (ref 0.35–5.50)

## 2023-03-14 LAB — VITAMIN D 25 HYDROXY (VIT D DEFICIENCY, FRACTURES): VITD: 27.28 ng/mL — ABNORMAL LOW (ref 30.00–100.00)

## 2023-03-14 MED ORDER — NORETHINDRONE ACET-ETHINYL EST 1.5-30 MG-MCG PO TABS: 1.0000 | ORAL_TABLET | Freq: Every day | ORAL | 3 refills | Status: DC

## 2023-03-14 NOTE — Telephone Encounter (Signed)
 Copied from CRM [id number]. Topic: General - Other >> Mar 14, 2023  9:43 AM Theodis Sato wrote: Reason for CRM: Misty Stanley from [facility] states they cannot accept this patient as they are no longer taking new medicaid patients.

## 2023-03-14 NOTE — Assessment & Plan Note (Signed)
Chronic, stable. Check CMP, CBC, lipid panel and treat based on results.

## 2023-03-14 NOTE — Assessment & Plan Note (Signed)
Continue iron supplement every other day and will check CBC and iron panel and adjust regimen based on results.

## 2023-03-14 NOTE — Patient Instructions (Signed)
 It was great to see you!  We are checking your labs today and will let you know the results via mychart/phone.   Start birth control pill every day at the same time for your heavy periods  I have placed referrals to GYN and GI  Let's follow-up in 1 year, sooner if you have concerns.  If a referral was placed today, you will be contacted for an appointment. Please note that routine referrals can sometimes take up to 3-4 weeks to process. Please call our office if you haven't heard anything after this time frame.  Take care,  Tinnie Harada, NP

## 2023-03-14 NOTE — Progress Notes (Signed)
 BP 118/80 (BP Location: Left Arm, Patient Position: Sitting, Cuff Size: Normal)   Pulse 80   Temp 98 F (36.7 C)   Ht 5' 8 (1.727 m)   Wt 236 lb 6.4 oz (107.2 kg)   LMP 02/22/2023 (Exact Date)   SpO2 98%   BMI 35.94 kg/m    Subjective:    Patient ID: Meghan Fernandez, female    DOB: May 25, 1980, 43 y.o.   MRN: [id number]  CC: Chief Complaint  Patient presents with   Annual Exam    With fasting lab work, menstrual cycle every 2 weeks    HPI: Meghan Fernandez is a 43 y.o. female presenting on 03/14/2023 for comprehensive medical examination. Current medical complaints include: menses every 2 weeks  She has been experiencing vaginal itching along with bumps for the past 2 weeks. She did note that she shaved recently. She denies discharge, odor, and dysuria.   She also notes that she is still having her menses every 2 weeks. This has been going on for about a year. She does have hemorrhoids and didn't want a hysterectomy. She states the birth control pill has helped in the past and would be interested in re-starting this.  She currently lives with: 2 kids Menopausal Symptoms: no  Depression and Anxiety Screen done today and results listed below:     03/14/2023    8:11 AM 08/31/2022    8:09 AM 04/18/2022   10:27 AM 02/02/2022    8:43 AM  Depression screen PHQ 2/9  Decreased Interest 0 0 0 0  Down, Depressed, Hopeless 0 0 0 0  PHQ - 2 Score 0 0 0 0  Altered sleeping 0   3  Tired, decreased energy 0   1  Change in appetite 0   3  Feeling bad or failure about yourself  0   0  Trouble concentrating 0   0  Moving slowly or fidgety/restless 0   0  Suicidal thoughts 0   0  PHQ-9 Score 0   7  Difficult doing work/chores    Not difficult at all      03/14/2023    8:11 AM 02/02/2022    8:43 AM  GAD 7 : Generalized Anxiety Score  Nervous, Anxious, on Edge 0 0  Control/stop worrying 0 0  Worry too much - different things 0 0  Trouble relaxing 0 0  Restless 0 0  Easily annoyed or  irritable 0 0  Afraid - awful might happen 0 0  Total GAD 7 Score 0 0  Anxiety Difficulty  Not difficult at all    The patient does not have a history of falls. I did not complete a risk assessment for falls. A plan of care for falls was not documented.   Past Medical History:  Past Medical History:  Diagnosis Date   Allergy    Anemia    Asthma    childhood no inhaler now   External hemorrhoids with complication    Fibroids    History of asthma    childhood   History of chicken pox     Surgical History:  History reviewed. No pertinent surgical history.  Medications:  Current Outpatient Medications on File Prior to Visit  Medication Sig   Cholecalciferol (VITAMIN D -3) 25 MCG (1000 UT) CAPS Take 1 capsule by mouth daily.   ferrous sulfate  325 (65 FE) MG EC tablet Take 325 mg by mouth every other day.   hydrocortisone  (ANUSOL -HC) 25 MG  suppository Place 1 suppository (25 mg total) rectally 2 (two) times daily.   metroNIDAZOLE  (METROGEL ) 0.75 % vaginal gel Place 1 Applicatorful vaginally at bedtime. (Patient not taking: Reported on 03/14/2023)   miconazole (MICOTIN) 2 % cream Apply 1 Application topically 2 (two) times daily. (Patient not taking: Reported on 03/14/2023)   No current facility-administered medications on file prior to visit.    Allergies:  No Known Allergies  Social History:  Social History   Socioeconomic History   Marital status: Single    Spouse name: Not on file   Number of children: Not on file   Years of education: Not on file   Highest education level: Not on file  Occupational History   Not on file  Tobacco Use   Smoking status: Never    Passive exposure: Never   Smokeless tobacco: Never  Vaping Use   Vaping status: Never Used  Substance and Sexual Activity   Alcohol use: No   Drug use: No   Sexual activity: Yes    Birth control/protection: None  Other Topics Concern   Not on file  Social History Narrative   Not on file   Social  Drivers of Health   Financial Resource Strain: Not on file  Food Insecurity: Not on file  Transportation Needs: Not on file  Physical Activity: Not on file  Stress: Not on file  Social Connections: Not on file  Intimate Partner Violence: Not on file   Social History   Tobacco Use  Smoking Status Never   Passive exposure: Never  Smokeless Tobacco Never   Social History   Substance and Sexual Activity  Alcohol Use No    Family History:  Family History  Problem Relation Age of Onset   Hyperlipidemia Mother    Hypertension Mother    Peripheral vascular disease Mother    Cancer Mother        thyroid   Diabetes Mother    Hyperlipidemia Father    Hypertension Father    Cancer Father        prostate   Diabetes Sister    Breast cancer Maternal Aunt    Diabetes Brother     Past medical history, surgical history, medications, allergies, family history and social history reviewed with patient today and changes made to appropriate areas of the chart.   Review of Systems  Constitutional: Negative.   HENT: Negative.    Eyes: Negative.   Respiratory: Negative.    Cardiovascular: Negative.   Gastrointestinal:  Positive for constipation. Negative for diarrhea, nausea and vomiting.  Genitourinary:  Negative for dysuria, frequency and urgency.       Menses every 2 weeks  Musculoskeletal: Negative.   Skin: Negative.   Neurological: Negative.   Psychiatric/Behavioral: Negative.     All other ROS negative except what is listed above and in the HPI.      Objective:    BP 118/80 (BP Location: Left Arm, Patient Position: Sitting, Cuff Size: Normal)   Pulse 80   Temp 98 F (36.7 C)   Ht 5' 8 (1.727 m)   Wt 236 lb 6.4 oz (107.2 kg)   LMP 02/22/2023 (Exact Date)   SpO2 98%   BMI 35.94 kg/m   Wt Readings from Last 3 Encounters:  03/14/23 236 lb 6.4 oz (107.2 kg)  02/23/23 236 lb (107 kg)  12/23/22 229 lb (103.9 kg)    Physical Exam Vitals and nursing note reviewed.   Constitutional:      General:  She is not in acute distress.    Appearance: Normal appearance.  HENT:     Head: Normocephalic and atraumatic.     Right Ear: Tympanic membrane, ear canal and external ear normal.     Left Ear: Tympanic membrane, ear canal and external ear normal.  Eyes:     Conjunctiva/sclera: Conjunctivae normal.  Cardiovascular:     Rate and Rhythm: Normal rate and regular rhythm.     Pulses: Normal pulses.     Heart sounds: Normal heart sounds.  Pulmonary:     Effort: Pulmonary effort is normal.     Breath sounds: Normal breath sounds.  Abdominal:     Palpations: Abdomen is soft.     Tenderness: There is no abdominal tenderness.  Genitourinary:    Comments: Declined  Musculoskeletal:        General: Normal range of motion.     Cervical back: Normal range of motion and neck supple.     Right lower leg: No edema.     Left lower leg: No edema.  Lymphadenopathy:     Cervical: No cervical adenopathy.  Skin:    General: Skin is warm and dry.  Neurological:     General: No focal deficit present.     Mental Status: She is alert and oriented to person, place, and time.     Cranial Nerves: No cranial nerve deficit.     Coordination: Coordination normal.     Gait: Gait normal.  Psychiatric:        Mood and Affect: Mood normal.        Behavior: Behavior normal.        Thought Content: Thought content normal.        Judgment: Judgment normal.     Results for orders placed or performed in visit on 12/23/22  Cervicovaginal ancillary only   Collection Time: 12/23/22  4:51 PM  Result Value Ref Range   Neisseria Gonorrhea Negative    Chlamydia Negative    Trichomonas Negative    Bacterial Vaginitis (gardnerella) Negative    Candida Vaginitis Negative    Candida Glabrata Negative    Comment      Normal Reference Range Bacterial Vaginosis - Negative   Comment Normal Reference Range Candida Species - Negative    Comment Normal Reference Range Candida Galbrata -  Negative    Comment Normal Reference Range Trichomonas - Negative    Comment Normal Reference Ranger Chlamydia - Negative    Comment      Normal Reference Range Neisseria Gonorrhea - Negative      Assessment & Plan:   Problem List Items Addressed This Visit       Digestive   Chronic constipation   Chronic, stable.  She takes smooth move every evening, otherwise she will have a bowel movement for a week.  She saw GI, but would like a second opinion. Referral placed.       Relevant Orders   Ambulatory referral to Gastroenterology     Genitourinary   Abnormal uterine bleeding   She has been getting her menses every 2.5 weeks. She does have a history of fibroids. Will check FSH/LH, testosterone , prolacitin, TSH and place referral to GYN. Will also start combined OCP daily to help with bleeding.       Relevant Orders   TSH   FSH/LH   Prolactin   Testosterone    Ambulatory referral to Gynecology     Other   Obesity (BMI 30-39.9)   BMI  35.9 She has lost 17 pounds over the last year. Congratulated her on this! Continue focus on nutrition and exercise.       Anemia   Continue iron supplement every other day and will check CBC and iron panel and adjust regimen based on results.      Relevant Orders   CBC with Differential/Platelet   Iron, TIBC and Ferritin Panel   Vitamin D  deficiency   Continue vitamin D  supplement 2000 units daily.  Check vitamin D  level and adjust regimen based on results.      Relevant Orders   VITAMIN D  25 Hydroxy (Vit-D Deficiency, Fractures)   Pure hypercholesterolemia   Chronic, stable. Check CMP, CBC, lipid panel and treat based on results.       Relevant Orders   Lipid panel   Other Visit Diagnoses       Encounter for general adult medical examination with abnormal findings    -  Primary   Health maintenance reviewed and updated. Check CMP, CBC today. Discussed nutrition, exercise. Follow-up 1 year.   Relevant Orders   CBC with  Differential/Platelet   Comprehensive metabolic panel     Vaginal itching       May be from shaving. Can use monistat cream to affected areas. Check swab for BV, yeast.   Relevant Orders   Cervicovaginal ancillary only     Encounter for hepatitis C screening test for low risk patient       Screen hepatitis C today   Relevant Orders   Hepatitis C antibody        Follow up plan: Return in about 1 year (around 03/13/2024) for CPE.   LABORATORY TESTING:  - Pap smear: up to date  IMMUNIZATIONS:   - Tdap: Tetanus vaccination status reviewed: last tetanus booster within 10 years. - Influenza:  Declined - Pneumovax: Not applicable - Prevnar: Not applicable - HPV: Not applicable - Shingrix vaccine: Not applicable  SCREENING: -Mammogram: Up to date  - Colonoscopy: Not applicable  - Bone Density: Not applicable   PATIENT COUNSELING:   Advised to take 1 mg of folate supplement per day if capable of pregnancy.   Sexuality: Discussed sexually transmitted diseases, partner selection, use of condoms, avoidance of unintended pregnancy  and contraceptive alternatives.   Advised to avoid cigarette smoking.  I discussed with the patient that most people either abstain from alcohol or drink within safe limits (<=14/week and <=4 drinks/occasion for males, <=7/weeks and <= 3 drinks/occasion for females) and that the risk for alcohol disorders and other health effects rises proportionally with the number of drinks per week and how often a drinker exceeds daily limits.  Discussed cessation/primary prevention of drug use and availability of treatment for abuse.   Diet: Encouraged to adjust caloric intake to maintain  or achieve ideal body weight, to reduce intake of dietary saturated fat and total fat, to limit sodium intake by avoiding high sodium foods and not adding table salt, and to maintain adequate dietary potassium and calcium preferably from fresh fruits, vegetables, and low-fat dairy  products.    stressed the importance of regular exercise  Injury prevention: Discussed safety belts, safety helmets, smoke detector, smoking near bedding or upholstery.   Dental health: Discussed importance of regular tooth brushing, flossing, and dental visits.    NEXT PREVENTATIVE PHYSICAL DUE IN 1 YEAR. Return in about 1 year (around 03/13/2024) for CPE.  Eddis Pingleton A Cecilie Heidel

## 2023-03-14 NOTE — Telephone Encounter (Signed)
 Sent to [employer] [employer]

## 2023-03-14 NOTE — Assessment & Plan Note (Signed)
 Chronic, stable.  She takes smooth move every evening, otherwise she will have a bowel movement for a week.  She saw GI, but would like a second opinion. Referral placed.

## 2023-03-14 NOTE — Assessment & Plan Note (Signed)
 BMI 35.9 She has lost 17 pounds over the last year. Congratulated her on this! Continue focus on nutrition and exercise.

## 2023-03-14 NOTE — Assessment & Plan Note (Signed)
Continue vitamin D supplement 2000 units daily.  Check vitamin D level and adjust regimen based on results.

## 2023-03-14 NOTE — Assessment & Plan Note (Signed)
 She has been getting her menses every 2.5 weeks. She does have a history of fibroids. Will check FSH/LH, testosterone, prolacitin, TSH and place referral to GYN. Will also start combined OCP daily to help with bleeding.

## 2023-03-15 LAB — IRON,TIBC AND FERRITIN PANEL
%SAT: 11 % — ABNORMAL LOW (ref 16–45)
Ferritin: 21 ng/mL (ref 16–232)
Iron: 37 ug/dL — ABNORMAL LOW (ref 40–190)
TIBC: 330 ug/dL (ref 250–450)

## 2023-03-15 LAB — HEPATITIS C ANTIBODY: Hepatitis C Ab: NONREACTIVE

## 2023-03-15 LAB — PROLACTIN: Prolactin: 13.1 ng/mL

## 2023-03-15 LAB — FSH/LH
FSH: 3.5 m[IU]/mL
LH: 5 m[IU]/mL

## 2023-03-16 LAB — CERVICOVAGINAL ANCILLARY ONLY
Bacterial Vaginitis (gardnerella): POSITIVE — AB
Candida Glabrata: NEGATIVE
Candida Vaginitis: NEGATIVE
Chlamydia: NEGATIVE
Comment: NEGATIVE
Comment: NEGATIVE
Comment: NEGATIVE
Comment: NEGATIVE
Comment: NEGATIVE
Comment: NORMAL
Neisseria Gonorrhea: NEGATIVE
Trichomonas: NEGATIVE

## 2023-03-17 ENCOUNTER — Encounter: Payer: Self-pay | Admitting: Nurse Practitioner

## 2023-03-17 MED ORDER — VITAMIN D (ERGOCALCIFEROL) 1.25 MG (50000 UNIT) PO CAPS: 50000.0000 [IU] | ORAL_CAPSULE | ORAL | 0 refills | Status: DC

## 2023-03-17 MED ORDER — METRONIDAZOLE 0.75 % VA GEL: 1.0000 | Freq: Every day | VAGINAL | 0 refills | Status: DC

## 2023-03-17 NOTE — Addendum Note (Signed)
Addended by: Rodman Pickle A on: 03/17/2023 08:06 AM   Modules accepted: Orders

## 2023-03-24 ENCOUNTER — Ambulatory Visit: Payer: Medicaid Other | Admitting: Nurse Practitioner

## 2023-03-26 NOTE — Progress Notes (Unsigned)
GYNECOLOGY  VISIT   HPI: Meghan Fernandez is a 43 y.o.  single female with history of uterine fibroids G2P2002 here for abnormal uterine bleeding.      Patient was last seen and referred by PCP 03/14/23 with labs indicating +BV; TSH, FSH/LH, PRL, testosterone levels within normal limits. Prescribed Flagyl, vit D, COCPs for regulation of menses.   In the interim, the patient has finished 5 day course Flagyl. No longer having itching, abnormal discharge or vaginal odor. Has had BV 4 times over the past year.   Menstrual hx Onset of new bleeding pattern: Four years ago Cycle regularity: Regular Length of cycles: 18-20 days Duration of bleeding: 7-10 days Menstrual flow: "heavy bleeding," changing tampons/pads every hour They have previously tried: Lysteda 4 years ago without significant reduction in volume of bleeding or spacing in periods. Started COCPs 2 weeks ago, has had no bleeding since then.  Previous TVUS: Four years ago with outside provider Associated symptoms: urinary frequency, urinary urgency, constipation  Patient denies pelvic pain, dysmenorrhea, headaches, mood changes, dyspareunia, pelvic pressure  GYN hx No LMP recorded. Desires future pregnancy: yes Last mammogram: 03/02/23 with repeat needed in 6 months Last pap smear: 03/10/22 NILM HPV neg, no known history of abnormal pap         OB History     Gravida  2   Para  2   Term  2   Preterm  0   AB  0   Living  2      SAB  0   IAB  0   Ectopic  0   Multiple  0   Live Births  2              Patient Active Problem List   Diagnosis Date Noted   Pure hypercholesterolemia 03/14/2023   Abnormal uterine bleeding 03/14/2023   Morbid obesity (HCC) 08/31/2022   Vitamin D deficiency 03/10/2022   Environmental allergies 02/02/2022   Obesity (BMI 30-39.9) 02/02/2022   Chronic constipation 02/02/2022   Anemia 02/02/2022   Insomnia 02/02/2022   Menorrhagia with regular cycle 02/02/2022   Urinary frequency  02/02/2022    Past Medical History:  Diagnosis Date   Allergy    Anemia    Asthma    childhood no inhaler now   External hemorrhoids with complication    Fibroids    History of asthma    childhood   History of chicken pox     No past surgical history on file.  Current Outpatient Medications  Medication Sig Dispense Refill   Cholecalciferol (VITAMIN D-3) 25 MCG (1000 UT) CAPS Take 1 capsule by mouth daily.     ferrous sulfate 325 (65 FE) MG EC tablet Take 325 mg by mouth every other day.     hydrocortisone (ANUSOL-HC) 25 MG suppository Place 1 suppository (25 mg total) rectally 2 (two) times daily. 12 suppository 0   metroNIDAZOLE (METROGEL) 0.75 % vaginal gel Place 1 Applicatorful vaginally at bedtime. 70 g 0   miconazole (MICOTIN) 2 % cream Apply 1 Application topically 2 (two) times daily. (Patient not taking: Reported on 03/14/2023)     Norethindrone Acetate-Ethinyl Estradiol (LOESTRIN 1.5/30, 21,) 1.5-30 MG-MCG tablet Take 1 tablet by mouth daily. 84 tablet 3   Vitamin D, Ergocalciferol, (DRISDOL) 1.25 MG (50000 UNIT) CAPS capsule Take 1 capsule (50,000 Units total) by mouth every 7 (seven) days. 4 capsule 0   No current facility-administered medications for this visit.  ALLERGIES: Patient has no known allergies.  Family History  Problem Relation Age of Onset   Hyperlipidemia Mother    Hypertension Mother    Peripheral vascular disease Mother    Cancer Mother        thyroid   Diabetes Mother    Hyperlipidemia Father    Hypertension Father    Cancer Father        prostate   Diabetes Sister    Breast cancer Maternal Aunt    Diabetes Brother     Social History   Socioeconomic History   Marital status: Single    Spouse name: Not on file   Number of children: Not on file   Years of education: Not on file   Highest education level: Not on file  Occupational History   Not on file  Tobacco Use   Smoking status: Never    Passive exposure: Never   Smokeless  tobacco: Never  Vaping Use   Vaping status: Never Used  Substance and Sexual Activity   Alcohol use: No   Drug use: No   Sexual activity: Yes    Birth control/protection: None  Other Topics Concern   Not on file  Social History Narrative   Not on file   Social Drivers of Health   Financial Resource Strain: Not on file  Food Insecurity: Not on file  Transportation Needs: Not on file  Physical Activity: Not on file  Stress: Not on file  Social Connections: Not on file  Intimate Partner Violence: Not on file    Review of Systems  PHYSICAL EXAMINATION:    There were no vitals taken for this visit.    General appearance: alert, cooperative and appears stated age Head: Normocephalic, without obvious abnormality, atraumatic Neck: no adenopathy, supple, symmetrical, trachea midline and thyroid normal to inspection and palpation Lungs: clear to auscultation bilaterally Breasts: Deferred Heart: regular rate and rhythm Abdomen: soft, non-tender, no masses,  no organomegaly Extremities: extremities normal, atraumatic, no cyanosis or edema Skin: Skin color, texture, turgor normal. No rashes or lesions Neurologic: Grossly normal Pelvic: Deferred  ASSESSMENT & PLAN  1. Abnormal uterine bleeding (Primary) Patient HDS with regular, heavy bleeding pattern consistent with presence of uterine fibroids.  Recent CBC,TSH,FSH/LH,PRL, testosterone levels within normal limits. Remote history of failed trial Lysteda. Patient started on COCPs two weeks ago with discontinuation of bleeding since then [facility] 2 with single BP 154/86 today). History of fibroids noted in chart without TVUS on file, will order today. Patient disposition pending results.   - Treatment options for symptomatic uterine fibroids, including expectant management, medical therapy, and surgical therapy were discussed.  - Patient ultimately prefers surgical management, and will continue COCP therapy in interim. If fibroids appear to  be source of AUB, will schedule next visit with MD for consultation. Consider EMB otherwise.  - US PELVIC COMPLETE WITH TRANSVAGINAL; Future   2. Bacterial vaginosis Completed Flagyl x5 days, symptoms have resolved.  - Patient education given re: possible etiologies of BV and prevention.  - Discussed if symptoms reoccur, will treat for persistent BV.   3. Breast mass in female Last mammogram 03/02/23 with stable, likely cystic mass of right breast with recommendation for bilateral diagnostic mammogram and right breast ultrasound in 6 months. Mammo ordered and patient reminded regarding follow-up.    An After Visit Summary was printed and given to the patient.  Ralene Muskrat, [place] 1/30/20258:25 AM

## 2023-03-30 ENCOUNTER — Encounter: Payer: Self-pay | Admitting: Physician Assistant

## 2023-03-30 ENCOUNTER — Ambulatory Visit: Payer: Medicaid Other | Admitting: Physician Assistant

## 2023-03-30 VITALS — BP 154/86 | HR 53 | Ht 69.0 in | Wt 228.0 lb

## 2023-03-30 DIAGNOSIS — N760 Acute vaginitis: Secondary | ICD-10-CM | POA: Diagnosis not present

## 2023-03-30 DIAGNOSIS — B9689 Other specified bacterial agents as the cause of diseases classified elsewhere: Secondary | ICD-10-CM

## 2023-03-30 DIAGNOSIS — N939 Abnormal uterine and vaginal bleeding, unspecified: Secondary | ICD-10-CM

## 2023-03-30 DIAGNOSIS — N63 Unspecified lump in unspecified breast: Secondary | ICD-10-CM | POA: Diagnosis not present

## 2023-03-30 NOTE — Progress Notes (Signed)
States urinating frequently. No burning or retention.

## 2023-03-30 NOTE — Patient Instructions (Signed)
You will be called to schedule your ultrasound.

## 2023-04-04 ENCOUNTER — Ambulatory Visit (HOSPITAL_BASED_OUTPATIENT_CLINIC_OR_DEPARTMENT_OTHER): Payer: Medicaid Other

## 2023-04-06 ENCOUNTER — Other Ambulatory Visit: Payer: Self-pay | Admitting: Nurse Practitioner

## 2023-04-07 ENCOUNTER — Ambulatory Visit (HOSPITAL_BASED_OUTPATIENT_CLINIC_OR_DEPARTMENT_OTHER)
Admission: RE | Admit: 2023-04-07 | Discharge: 2023-04-07 | Disposition: A | Discharge status: Home / Self Care | Payer: Medicaid Other | Source: Ambulatory Visit | Attending: Physician Assistant | Admitting: Physician Assistant

## 2023-04-07 DIAGNOSIS — N939 Abnormal uterine and vaginal bleeding, unspecified: Secondary | ICD-10-CM | POA: Diagnosis not present

## 2023-04-07 DIAGNOSIS — N852 Hypertrophy of uterus: Secondary | ICD-10-CM | POA: Diagnosis not present

## 2023-04-07 DIAGNOSIS — N854 Malposition of uterus: Secondary | ICD-10-CM | POA: Diagnosis not present

## 2023-04-07 DIAGNOSIS — D259 Leiomyoma of uterus, unspecified: Secondary | ICD-10-CM | POA: Diagnosis not present

## 2023-04-07 NOTE — Telephone Encounter (Signed)
 Requesting: VITAMIN D2 1.25MG (50,000 UNIT)  Last Visit: 03/14/2023 Next Visit: Visit date not found Last Refill: 03/17/2023  Please Advise   Per last refill she is to take for 4 weeks, She does not want her to her Vitamin D  level too high

## 2023-04-13 NOTE — Progress Notes (Unsigned)
ASSESSMENT    Brief Narrative:  43 y.o.  female known to Dr. 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  See PMH for any additional medical & surgical history   Chronic bloating Malodorous flatulence Chronic constipation with infrequent, hard and difficult to evacuate stools.  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 Iron possibly contributing.  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  Most likely secondary to hemorrhoids    PLAN  --Treat constipation to see if bloating / flatus improve.  --Try to drink at least 48 oz  --Trial of Amitiza 8 mcg BID. --If unable to get Amitiza due to cost then start  Miralax 1 capful mixed in 8 ounces of water 1-2 times daily daily --Stop fiber due to excessive bloating --Schedule for a colonoscopy for evaluation of rectal bleeding. The risks and benefits of colonoscopy with possible polypectomy / biopsies were discussed and the patient agrees to proceed.  --Discussed that colonoscopy would most certainly not determine cause of constipation rather exclude any lesions as cause of rectal bleeding.   --If constipation persists consider sitz marker study to assess for colon motility.  If BM urges improve but still having difficult evacuation then then recommend pelvic floor PT for functional outlet obstruction.  --Discussed optimal positioning for defecation and elevation of feet on stool  6-8 inches. --Discussed use of glycerin suppositories as needed to avoid straining --Evaluation of hemorrhoids at time of colonoscopy. If bleeding appears to be from internal hemorrhoids then consider banding at later date  HPI   Chief complaint :bloating, foul gas, constipation, headaches, poor appetite  Brief GI History  Established care here in April 2024 for constipation / bloating, malodorous flatus, . We recommended fiber , continuation of smooth move tea and pelvic floor  PT.    INTERVAL HISTORY:  Meghan Fernandez is back for evaluation of persistent abdominal bloating, foul gas, constipation, headaches, poor appetite. Constipation characterized by very infrequent BMs, hard and difficult to evacuate stools . She is upset that none of these issues have been addressed. She has had these symptoms for years.   Corliss is taking Fiber but it has never really worked for her. Difficult to drink a lot of water as it makes her have to urinate a lot which is not conducive to work schedule.  If she drinks Smooth Move tea every day she will have a soft and easy to evacuate BM most,  but not everyday. Occasionally has rectal bleeding when constipated. Dulcolax works but gives her diarrhea.  She didn't go to pelvic floor PT as discussed at prior visit. She wants work up / answers for her condition.    Has no appetite.  Weight is stable  Patient tells me her thyroid studies are always normal.    GI History / Pertinent GI Studies   none     Latest Ref Rng & Units 03/14/2023    8:37 AM 02/18/2022   10:01 AM  Hepatic Function  Total Protein 6.0 - 8.3 g/dL 6.8  7.0   Albumin 3.5 - 5.2 g/dL 3.8  3.8   AST 0 - 37 U/L 20  16   ALT 0 - 35 U/L 13  10   Alk Phosphatase 39 - 117 U/L 44  52   Total Bilirubin 0.2 - 1.2 mg/dL 0.4  0.4        Latest Ref Rng & Units 03/14/2023    8:37 AM 08/31/2022  8:57 AM 03/10/2022    9:06 AM  CBC  WBC 4.0 - 10.5 K/uL 3.2  3.0  3.4   Hemoglobin 12.0 - 15.0 g/dL 40.9  81.1  91.4   Hematocrit 36.0 - 46.0 % 38.8  35.6  35.3   Platelets 150.0 - 400.0 K/uL 182.0  227.0  223.0      Past Medical History:  Diagnosis Date   Allergy    Anemia    Asthma    childhood no inhaler now   External hemorrhoids with complication    Fibroids    History of asthma    childhood   History of chicken pox     No past surgical history on file.  Family History  Problem Relation Age of Onset   Hyperlipidemia Mother    Hypertension Mother    Peripheral vascular  disease Mother    Cancer Mother        thyroid   Diabetes Mother    Hyperlipidemia Father    Hypertension Father    Cancer Father        prostate   Diabetes Sister    Breast cancer Maternal Aunt    Diabetes Brother     Current Medications, Allergies, Family History and Social History were reviewed in [employer] electronic medical record.     Current Outpatient Medications  Medication Sig Dispense Refill   Cholecalciferol (VITAMIN D-3) 25 MCG (1000 UT) CAPS Take 1 capsule by mouth daily.     ferrous sulfate 325 (65 FE) MG EC tablet Take 325 mg by mouth every other day.     hydrocortisone (ANUSOL-HC) 25 MG suppository Place 1 suppository (25 mg total) rectally 2 (two) times daily. 12 suppository 0   metroNIDAZOLE (METROGEL) 0.75 % vaginal gel Place 1 Applicatorful vaginally at bedtime. (Patient not taking: Reported on 03/30/2023) 70 g 0   miconazole (MICOTIN) 2 % cream Apply 1 Application topically 2 (two) times daily. (Patient not taking: Reported on 02/23/2023)     Norethindrone Acetate-Ethinyl Estradiol (LOESTRIN 1.5/30, 21,) 1.5-30 MG-MCG tablet Take 1 tablet by mouth daily. 84 tablet 3   Vitamin D, Ergocalciferol, (DRISDOL) 1.25 MG (50000 UNIT) CAPS capsule Take 1 capsule (50,000 Units total) by mouth every 7 (seven) days. 4 capsule 0   No current facility-administered medications for this visit.    Review of Systems: Positive for headaches. No chest pain. No shortness of breath. No urinary complaints.    Physical Exam  There were no vitals filed for this visit. Wt Readings from Last 3 Encounters:  03/30/23 228 lb (103.4 kg)  03/14/23 236 lb 6.4 oz (107.2 kg)  02/23/23 236 lb (107 kg)    LMP 03/12/2023 (Approximate)  Constitutional:  Pleasant female in no acute distress. Psychiatric: Normal mood and affect. Behavior is normal. EENT: Pupils normal.  Conjunctivae are normal. No scleral icterus. Neck supple.  Cardiovascular: Normal rate, regular rhythm.   Pulmonary/chest: Effort normal and breath sounds normal. No wheezing, rales or rhonchi. Abdominal: Soft, nondistended, nontender. Bowel sounds active throughout. There are no masses palpable. No hepatomegaly. Neurological: Alert and oriented to person place and time.    Willette Cluster, NP  04/13/2023, 8:27 PM

## 2023-04-14 ENCOUNTER — Encounter: Payer: Self-pay | Admitting: Nurse Practitioner

## 2023-04-14 ENCOUNTER — Ambulatory Visit: Payer: Medicaid Other | Admitting: Nurse Practitioner

## 2023-04-14 VITALS — BP 118/74 | HR 66 | Ht 69.0 in | Wt 229.4 lb

## 2023-04-14 DIAGNOSIS — R140 Abdominal distension (gaseous): Secondary | ICD-10-CM

## 2023-04-14 DIAGNOSIS — K5909 Other constipation: Secondary | ICD-10-CM | POA: Diagnosis not present

## 2023-04-14 DIAGNOSIS — K625 Hemorrhage of anus and rectum: Secondary | ICD-10-CM

## 2023-04-14 DIAGNOSIS — R143 Flatulence: Secondary | ICD-10-CM

## 2023-04-14 MED ORDER — LUBIPROSTONE 8 MCG PO CAPS: 8.0000 ug | ORAL_CAPSULE | Freq: Two times a day (BID) | ORAL | 3 refills | Status: DC

## 2023-04-14 MED ORDER — SUFLAVE 178.7 G PO SOLR: 1.0000 | Freq: Once | ORAL | 0 refills | Status: AC

## 2023-04-14 NOTE — Patient Instructions (Addendum)
_______________________________________________________  If your blood pressure at your visit was 140/90 or greater, please contact your primary care physician to follow up on this.  _______________________________________________________  If you are age 43 or older, your body mass index should be between 23-30. Your Body mass index is 33.87 kg/m. If this is out of the aforementioned range listed, please consider follow up with your Primary Care Provider.  If you are age 85 or younger, your body mass index should be between 19-25. Your Body mass index is 33.87 kg/m. If this is out of the aformentioned range listed, please consider follow up with your Primary Care Provider.   ________________________________________________________  The [facility] providers would like to encourage you to use [facility] to communicate with providers for non-urgent requests or questions.  Due to long hold times on the telephone, sending your provider a message by [facility] may be a faster and more efficient way to get a response.  Please allow 48 business hours for a response.  Please remember that this is for non-urgent requests.  _______________________________________________________  Stop fiber  We have sent the following medications to your pharmacy for you to pick up at your convenience: Amitiza 1 tablet 2 times a day  If you are unable to get Amitiza then do miralax 17g 1-2 times a day   You have been scheduled for a colonoscopy. Please follow written instructions given to you at your visit today.   If you use inhalers (even only as needed), please bring them with you on the day of your procedure.  DO NOT TAKE 7 DAYS PRIOR TO TEST- Trulicity (dulaglutide) Ozempic, Wegovy (semaglutide) Mounjaro (tirzepatide) Bydureon Bcise (exanatide extended release)  DO NOT TAKE 1 DAY PRIOR TO YOUR TEST Rybelsus (semaglutide) Adlyxin (lixisenatide) Victoza (liraglutide) Byetta  (exanatide) ___________________________________________________________________________  Bonita Quin will receive your bowel preparation through [facility], which ensures the lowest copay and home delivery, with outreach via text or call from an [phone] number. Please respond promptly to avoid rescheduling of your procedure. If you are interested in alternative options or have any questions regarding your prep, please contact them at [phone] ____________________________________________________________________________  Your Provider Has Sent Your Bowel Prep Regimen To [facility]   [facility] will contact you to verify your information and collect your copay, if applicable. Enjoy the comfort of your home while your prescription is mailed to you, FREE of any shipping charges.   [facility] accepts all major insurance benefits and applies discounts & coupons.  Have additional questions?   Chat: [email or web address] Call: [phone] Email: [email or web address] NCPDP: [id number]  How will [facility] contact you?  With a Welcome phone call,  a Welcome text and a checkout link in text form.  Texts you receive from [phone] Are NOT Spam.  *To set up delivery, you must complete the checkout process via link or speak to one of the patient care representatives. If [facility] is unable to reach you, your prescription may be delayed.  To avoid long hold times on the phone, you may also utilize the secure chat feature on the [facility] website to request that they call you back for transaction completion or to expedite your concerns.  It was a pleasure to see you today!  Thank you for trusting me with your gastrointestinal care!

## 2023-04-21 NOTE — Progress Notes (Signed)
Agree with the assessment and plan as outlined by Paula Guenther, NP.  Scott E. Cunningham, MD [facility]  

## 2023-05-01 ENCOUNTER — Telehealth: Payer: Self-pay | Admitting: Obstetrics and Gynecology

## 2023-05-01 NOTE — Telephone Encounter (Signed)
 Called pt to schedule surgical consult. Left a detailed message to please call office to schedule appointment.

## 2023-05-09 ENCOUNTER — Institutional Professional Consult (permissible substitution): Admitting: Obstetrics and Gynecology

## 2023-05-19 ENCOUNTER — Ambulatory Visit: Admitting: Obstetrics and Gynecology

## 2023-05-19 ENCOUNTER — Other Ambulatory Visit (HOSPITAL_COMMUNITY)
Admission: RE | Admit: 2023-05-19 | Discharge: 2023-05-19 | Disposition: A | Discharge status: Home / Self Care | Source: Ambulatory Visit | Attending: Obstetrics and Gynecology | Admitting: Obstetrics and Gynecology

## 2023-05-19 VITALS — BP 122/77 | HR 56 | Ht 69.0 in | Wt 235.0 lb

## 2023-05-19 DIAGNOSIS — N3281 Overactive bladder: Secondary | ICD-10-CM | POA: Diagnosis not present

## 2023-05-19 DIAGNOSIS — D219 Benign neoplasm of connective and other soft tissue, unspecified: Secondary | ICD-10-CM

## 2023-05-19 DIAGNOSIS — N939 Abnormal uterine and vaginal bleeding, unspecified: Secondary | ICD-10-CM

## 2023-05-19 DIAGNOSIS — Z3202 Encounter for pregnancy test, result negative: Secondary | ICD-10-CM | POA: Diagnosis not present

## 2023-05-19 LAB — POCT URINE PREGNANCY: Preg Test, Ur: NEGATIVE

## 2023-05-19 MED ORDER — MYFEMBREE 40-1-0.5 MG PO TABS: 1.0000 | ORAL_TABLET | Freq: Every day | ORAL | 5 refills | Status: DC

## 2023-05-19 NOTE — Patient Instructions (Addendum)
 It was nice meeting you today! You will see your results in the MyChart app within 1 week  It is normal to have cramping and bleeding for the next 2-3 days. You should feel better every day.   Please call us if you have any severe pain, bleeding that soaks more than 1 pad in a hour, have fevers, or feel like you're going to pass out.   You can take tylenol 1000mg  every 8 hours and ibuprofen 800mg  every 8 hours as needed for pain. It is ok to take both at the same time.   Please STOP the birth control pills when you start the new medication   What is bladder training?  Bladder training means changing your habits to better control your bladder. It can help with urinary problems like:  ?Frequency - This means having to use the toilet very frequently.  ?Urgency - This means suddenly feeling the need to urinate right away.  ?Leakage - This is when urgency leads to actually leaking urine. It is also called "urge incontinence."  What is a bladder diary?  Bladder training involves trying to increase the amount of time between trips to the toilet. A "bladder diary" is a way to keep track of how often you go (form 1). Doctors sometimes call this a "voiding diary."  In the diary, write down:  ?The time you urinate  ?The amount of urine, if your doctor asked you to measure this  ?If you had any leaking, how much (small, medium, or large amount), and what you were doing when the leakage happened  ?The amounts and types of fluids you drink  ?Any other symptoms you have   How do I train my bladder?  Once you have an idea of how often you are urinating, try to wait a little longer between trips to the toilet. This should be a gradual process:  ?First, slightly increase the amount of time between bathroom visits. For example, if you normally go every hour, add 15 minutes. This means trying to wait 1 hour and 15 minutes between trips to the bathroom.  ?Keep following this schedule for  several days. If you can do this for 3 or 4 days without problems, increase the time again. For example, you can add 15 more minutes between trips to the bathroom.  ?Keep doing this until you can wait at least 2 to 3 hours between bathroom visits. It can take time to get to this point. Some people notice improvement within a few weeks of bladder training. But it can take longer.  Keep filling out your bladder diary as you work on bladder training. This will help you see improvement over time. The process might take several weeks.   What else can I do to help with urgency?  Part of bladder training involves learning to control the urge to urinate and make your bladder wait. Some things you can do to help with this:  ?Squeeze your pelvic muscles - These include the muscles that control the flow of urine. Try squeezing the muscles and then relaxing them. Repeat this several times.  ?Change positions - It might help to cross your legs or sit on a firm surface.  ?Distract your mind - Try to think about something else until the urge passes.  ?Relax - Stay still when you get the urge to urinate. It might help to do deep breathing exercises. Do not rush to the toilet when it is time to go.  What else should I know?  These tips might help with bladder training:  ?Try to only urinate when you actually need to go - For example, avoid going to the bathroom before leaving home "just in case." This can train your brain to think that your bladder is full when it really isn't.  ?Avoid drinking fluids soon before bedtime - This can lower the chances that you will need to urinate during the night.  ?Limit or avoid alcohol and caffeine - Some people find that these things make them need to urinate more.  When should I call the doctor?  Call your doctor or nurse if:  ?Your urinary symptoms are not getting better or are getting worse.  ?You are having trouble with your training schedule - Bladder  training can be frustrating and take time. But don't give up. Your doctor or nurse can help you.  ?You have other problems with urinating, such as pain or burning, not being able to urinate, or seeing blood in your urine.   FOODS TO AVOID IF YOU HAVE AN OVERACTIVE BLADDER  Alcoholic beverages (liquor, beer, wine) Brewer's Yeast Carbonated beverages (soda, seltzer water) Sports Drinks Tea Milk/milk products Sugar & artificial sweeteners Coffee (even decaffeinated) Tea - black or green, regular or decaffeinated Honey Medicines with caffeine Chocolate Tomatoes and tomato-based products Citrus juice & fruits Corn Syrup Highly spiced foods/chilies Raw onion Saccharin Sour cream Soy sauce Strawberries Vinegar Vitamins buffered with aspartame  OVERACTIVE BLADDER DIET BETTER FOODS TO INCORPORATE Lean Proteins - fish, chicken breast, [place], low-fat beef, and pork are good options. Eggs are also a good source of protein if you're trying to avoid meat. Fiber-Rich Foods - these foods are filling and can help prevent constipation, which can put extra pressure on your bladder. Almonds, oats, pears, raspberries lentils, and beans are all good options when you want to add more fiber to your diet. Fruits - while some fruits, especially citrus, can irritate the bladder, it's still important to incorporate them into your diet. Bananas, apples, grapes, coconut, and watermelon are good options for those with overactive bladder. Vegetables - Leafy greens, like kale, lettuce, cucumber, squash, potatoes, broccoli, carrots, celery and bell peppers. Nuts Whole grains, like oats, barley, farro, and quinoa (also a great protein). You may wish to eliminate all the foods on the "do not eat" list, then slowly reintroduce them back into your diet one by one to determine which ones your bladder finds irritating.

## 2023-05-19 NOTE — Progress Notes (Signed)
      GYNECOLOGY OFFICE PROCEDURE NOTE   Meghan Fernandez is a 43 y.o. [phone] here for endometrial biopsy for AUB.    ENDOMETRIAL BIOPSY     The indications for endometrial biopsy were reviewed.   Risks of the biopsy including cramping, bleeding, infection, uterine perforation, inadequate specimen and need for additional procedures were discussed. Offered alternative of hysteroscopy, dilation and curettage in OR. The patient states she understands the R/B/I/A and agrees to undergo procedure today. Urine pregnancy test was Negative. Consent was signed. Time out was performed.    Patient was positioned in dorsal lithotomy position. A vaginal speculum was placed.  The cervix was visualized and was prepped with Betadine.  A single-toothed tenaculum was placed on the anterior lip of the cervix to stabilize it. The 3 mm pipelle was easily introduced into the endometrial cavity without difficulty to a depth of 8 cm, and a Moderate amount of tissue was obtained after two passes and sent to pathology. The instruments were removed from the patient's vagina. Minimal bleeding from the cervix was noted. The patient tolerated the procedure well.   Patient was given post procedure instructions.  Will follow up pathology and manage accordingly; patient will be contacted with results and recommendations.  Routine preventative health maintenance measures emphasized.     Harvie Bridge, MD Obstetrician & Gynecologist, [facility] for [employer], [facility]

## 2023-05-19 NOTE — Progress Notes (Signed)
 RETURN GYNECOLOGY VISIT  Subjective:  Meghan Fernandez is a 43 y.o. G2P2002 on COCs & hx fibroids presenting for AUB follow up  4-5 years of cycles q18-20 days lasting 7-10 days and changing pads/tampons hourly.  Her main concerns with her bleeding is the frequency/irregularity because she is used to having heavy prolonged periods. Has tried lysteda with limited relief.  Is currently on COC's and her bleeding has improved.  She also has significant urinary symptoms that are affecting her quality of life and she thinks her fibroids may be contributing to them.  She notes urinary urgency with urge incontinence as well as leakage with cough/laugh/sneeze when her bladder is full.  She has seen 2 urologists and done urodynamic testing.  She remembers them offering pelvic floor PT and bladder Botox, but she could not afford the bills related to this.  She has to wear pads constantly to protect her close from urinary leakage.  She feels like she pees 50 times a day with small volumes as she wakes up at night to void as well.  She desires future fertility.  No contraindications to estrogen.  She is also struggling with recurrent BV.  I personally reviewed the following: - Pap 03/10/22 NILM/HPV neg - CBC 03/14/23 Hgb 12.5, plt 182, WBC 3.2? - TSH 1.14.25 1.37 - Pelvic US 04/07/23 18.3 x 14.7 x 14.3cm uterus with multiple fibroids, largest is anterior intramural measuring 6.7 x 6.2cm, EL unable to be assessed. Ovaries not visualized.  PMH: Asthma, anemia, chronic constipation, fibroids, BMI 34.7 PSH: no prior surgeries Meds: lubiprostone, vitamin D, iron, COCs All: NKDA OB: SVDx2 Soc: Denies t/e/d  Objective:   Vitals:   05/19/23 0838  BP: 122/77  Pulse: (!) 56  Weight: 235 lb (106.6 kg)  Height: 5\' 9"  (1.753 m)   General:  Alert, oriented and cooperative. Patient is in no acute distress.  Skin: Skin is warm and dry. No rash noted.   Cardiovascular: Normal heart rate noted  Respiratory:  Normal respiratory effort, no problems with respiration noted  Abdomen: Soft, non-tender, non-distended   Pelvic: NEFG. 19 week sized uterus with significant lateral bulk esp on the left size. Uterus is deviated towards the right.   Exam performed in the presence of a chaperone  Assessment and Plan:  Meghan Fernandez is a 43 y.o. with fibroids, AUB, and suspected OAB  Fibroids Abnormal uterine bleeding (AUB) - Combination bleeding/bulk symptoms - Given cycles less than 21 days and BMI 34, recommended endometrial sampling today.  Uncomplicated EMB performed, see procedure note below. -Discussed options for management as follows:  - TXA: Has already tried this was not discussed in detail  - Hormonal management: She has no contraindications to estrogen birth control pills, patch, ring, Depo, IUDs Nexplanon.  I do not recommend Depo or Nexplanon because of the irregular bleeding as this is already bothering the patient.  Would lean more towards an estrogen-containing option or IUD - GnRH-agonists and antagonists: Patient is a good candidate for Myfembree as she has no contraindications to estrogen in the will allow for treatment of both her bleeding and her bulk symptoms.  We discussed discussed anticipated side effects and long-term health implications.  We discussed that we can only safely use this medication in the short-term.  We talked about how we would use Lupron if surgical management is pursued to reduce blood loss and try to facilitate a minimally invasive approach - RFA (I.e. Sonata): Discussed that this can help with both  bleeding and bulk symptoms and has a short recovery time.  Reviewed limited data on pregnancy after treatment with RFA. - [place]: Discussed that this will help with both bleeding and bulk symptoms and has around a 2-week recovery time.  Reviewed that this would need to be done with an interventional radiologist.  We also reviewed that pregnancy safety data is limited after [place]  so this is not our first-line recommendation for women who are interested in future fertility. - Myomectomy: Discussed that I would recommend an open approach if we were to do a myomectomy given the size and location of her fibroids.  Discussed the risks with this procedure including higher risk of blood loss compared to hysterectomy as well as risk of hysterectomy if she has excessive blood loss.  Expected recovery time is 4 weeks and she will need a short hospitalization after surgery. - Hysterectomy: Reviewed that this is definitive management of her fibroids.  Would recommend preoperative Lupron given the significant lateral bulk on exam today to help reduce blood loss dilatated minimally invasive approach.  If minimally invasive this would be a same-day surgery with an approximate 4-week recovery time.  We did not discuss this option in depth that she desires future fertility. -After discussion patient opted to try Myfembree -     POCT urine pregnancy -     Surgical pathology( [facility]/ [facility]) -     Relugolix-Estradiol-Norethind (MYFEMBREE) 40-1-0.5 MG TABS; Take 1 tablet by mouth daily.  OAB (overactive bladder) Discussed that fibroids may be contributing to her symptoms but there are other options that we can try to help her symptoms Reviewed bladder diet, bladder training, pelvic floor physical therapy Discussed option for Myrbetriq if symptoms persist -     Ambulatory referral to Physical Therapy  Return in about 4 weeks (around 06/16/2023) for follow up gyn to check in on fibroid & BV.  Future Appointments  Date Time Provider Department Center  05/25/2023  7:30 AM Jenel Lucks, MD [facility]  06/22/2023  9:35 AM Lennart Pall, MD [facility] None  03/15/2024  8:20 AM Gerre Scull, NP [facility]    Lennart Pall, MD

## 2023-05-22 LAB — SURGICAL PATHOLOGY

## 2023-05-23 ENCOUNTER — Encounter: Payer: Self-pay | Admitting: Obstetrics and Gynecology

## 2023-05-25 ENCOUNTER — Encounter: Payer: Self-pay | Admitting: Obstetrics and Gynecology

## 2023-05-25 ENCOUNTER — Ambulatory Visit: Payer: Medicaid Other | Admitting: Gastroenterology

## 2023-05-25 ENCOUNTER — Encounter: Payer: Self-pay | Admitting: Gastroenterology

## 2023-05-25 VITALS — BP 155/83 | HR 68 | Temp 98.8°F | Resp 13 | Ht 69.0 in | Wt 229.6 lb

## 2023-05-25 DIAGNOSIS — K5909 Other constipation: Secondary | ICD-10-CM | POA: Diagnosis not present

## 2023-05-25 DIAGNOSIS — K644 Residual hemorrhoidal skin tags: Secondary | ICD-10-CM

## 2023-05-25 DIAGNOSIS — K625 Hemorrhage of anus and rectum: Secondary | ICD-10-CM | POA: Diagnosis not present

## 2023-05-25 DIAGNOSIS — Q439 Congenital malformation of intestine, unspecified: Secondary | ICD-10-CM | POA: Diagnosis not present

## 2023-05-25 DIAGNOSIS — D123 Benign neoplasm of transverse colon: Secondary | ICD-10-CM | POA: Diagnosis not present

## 2023-05-25 DIAGNOSIS — K640 First degree hemorrhoids: Secondary | ICD-10-CM | POA: Diagnosis not present

## 2023-05-25 MED ORDER — SODIUM CHLORIDE 0.9 % IV SOLN: 500.0000 mL | Freq: Once | INTRAVENOUS | Status: DC

## 2023-05-25 NOTE — Progress Notes (Signed)
 Called to room to assist during endoscopic procedure.  Patient ID and intended procedure confirmed with present staff. Received instructions for my participation in the procedure from the performing physician.

## 2023-05-25 NOTE — Op Note (Signed)
  [facility] Patient Name: Meghan Fernandez Procedure Date: 05/25/2023 8:03 AM MRN: [id number] Endoscopist: Lorin Picket E. Tomasa Rand , MD, [id number] Age: 43 Referring MD:  Date of Birth: 10-Jun-1980 Gender: Female Account #: [id number] Procedure:                Colonoscopy Indications:              Hematochezia Medicines:                Monitored Anesthesia Care Procedure:                Pre-Anesthesia Assessment:                           - Prior to the procedure, a History and Physical                            was performed, and patient medications and                            allergies were reviewed. The patient's tolerance of                            previous anesthesia was also reviewed. The risks                            and benefits of the procedure and the sedation                            options and risks were discussed with the patient.                            All questions were answered, and informed consent                            was obtained. Prior Anticoagulants: The patient has                            taken no anticoagulant or antiplatelet agents. ASA                            Grade Assessment: II - A patient with mild systemic                            disease. After reviewing the risks and benefits,                            the patient was deemed in satisfactory condition to                            undergo the procedure.                           After obtaining informed consent, the colonoscope  was passed under direct vision. Throughout the                            procedure, the patient's blood pressure, pulse, and                            oxygen saturations were monitored continuously. The                            Olympus Scope [id number] was introduced through the                            anus and advanced to the the cecum, identified by                            appendiceal orifice and ileocecal  valve. The                            colonoscopy was somewhat difficult due to                            significant looping and a tortuous colon.                            Successful completion of the procedure was aided by                            using manual pressure. The patient tolerated the                            procedure well. The quality of the bowel                            preparation was good. The ileocecal valve,                            appendiceal orifice, and rectum were photographed.                            The bowel preparation used was SUFLAVE via split                            dose instruction. Scope In: 8:24:26 AM Scope Out: 8:45:02 AM Scope Withdrawal Time: 0 hours 12 minutes 57 seconds  Total Procedure Duration: 0 hours 20 minutes 36 seconds  Findings:                 Skin tags were found on perianal exam.                           The digital rectal exam was normal. Pertinent                            negatives include normal sphincter tone and no  palpable rectal lesions.                           A 4 mm polyp was found in the transverse colon. The                            polyp was sessile. The polyp was removed with a                            cold snare. Resection and retrieval were complete.                            Estimated blood loss was minimal.                           The exam was otherwise normal throughout the                            examined colon.                           Non-bleeding internal hemorrhoids were found during                            retroflexion. The hemorrhoids were Grade I                            (internal hemorrhoids that do not prolapse).                           No additional abnormalities were found on                            retroflexion. Complications:            No immediate complications. Estimated Blood Loss:     Estimated blood loss was minimal. Impression:                - Perianal skin tags found on perianal exam.                           - One 4 mm polyp in the transverse colon, removed                            with a cold snare. Resected and retrieved.                           - Non-bleeding internal hemorrhoids. This is the                            source of the patient's hematochezia. Recommendation:           - Patient has a contact number available for                            emergencies. The signs and symptoms of  potential                            delayed complications were discussed with the                            patient. Return to normal activities tomorrow.                            Written discharge instructions were provided to the                            patient.                           - Resume previous diet.                           - Continue present medications.                           - Await pathology results.                           - Repeat colonoscopy (date not yet determined) for                            surveillance based on pathology results.                           - Follow in the office as needed for ongoing                            management of chronic constipation. [name] E. Tomasa Rand, MD 05/25/2023 8:50:27 AM This report has been signed electronically.

## 2023-05-25 NOTE — Progress Notes (Signed)
 [facility] History and Physical   Primary Care Physician:  Gerre Scull, NP   Reason for Procedure:   Rectal bleeding  Plan:    Colonoscopy     HPI: Meghan Fernandez is a 43 y.o. female undergoing colonoscopy to evaluate rectal bleeding.  She has chronic constipation and bloating.  Partial improvement with recent addition of lubiprostone.  No family history of colon cancer.    Past Medical History:  Diagnosis Date   Allergy    Anemia    Asthma    childhood no inhaler now   External hemorrhoids with complication    Fibroids    History of asthma    childhood   History of chicken pox     Past Surgical History:  Procedure Laterality Date   COLONOSCOPY      Prior to Admission medications   Medication Sig Start Date End Date Taking? Authorizing Provider  Cholecalciferol (VITAMIN D-3) 25 MCG (1000 UT) CAPS Take 1 capsule by mouth daily.   Yes [provider]  ferrous sulfate 325 (65 FE) MG EC tablet Take 325 mg by mouth every other day.   Yes [provider]  lubiprostone (AMITIZA) 8 MCG capsule Take 1 capsule (8 mcg total) by mouth 2 (two) times daily with a meal. 04/14/23  Yes Meredith Pel, NP  Relugolix-Estradiol-Norethind (MYFEMBREE) 40-1-0.5 MG TABS Take 1 tablet by mouth daily. Patient not taking: Reported on 05/25/2023 05/19/23   Lennart Pall, MD    Current Outpatient Medications  Medication Sig Dispense Refill   Cholecalciferol (VITAMIN D-3) 25 MCG (1000 UT) CAPS Take 1 capsule by mouth daily.     ferrous sulfate 325 (65 FE) MG EC tablet Take 325 mg by mouth every other day.     lubiprostone (AMITIZA) 8 MCG capsule Take 1 capsule (8 mcg total) by mouth 2 (two) times daily with a meal. 60 capsule 3   Relugolix-Estradiol-Norethind (MYFEMBREE) 40-1-0.5 MG TABS Take 1 tablet by mouth daily. (Patient not taking: Reported on 05/25/2023) 28 tablet 5   Current Facility-Administered Medications  Medication Dose Route Frequency  Provider Last Rate Last Admin   0.9 %  sodium chloride infusion  500 mL Intravenous Once Jenel Lucks, MD        Allergies as of 05/25/2023   (No Known Allergies)    Family History  Problem Relation Age of Onset   Colon polyps Mother    Hyperlipidemia Mother    Hypertension Mother    Peripheral vascular disease Mother    Cancer Mother        thyroid   Diabetes Mother    Hyperlipidemia Father    Hypertension Father    Cancer Father        prostate   Diabetes Sister    Diabetes Brother    Breast cancer Maternal Aunt    Colon cancer Neg Hx    Esophageal cancer Neg Hx    Rectal cancer Neg Hx    Stomach cancer Neg Hx     Social History   Socioeconomic History   Marital status: Single    Spouse name: Not on file   Number of children: Not on file   Years of education: Not on file   Highest education level: Not on file  Occupational History   Not on file  Tobacco Use   Smoking status: Never    Passive exposure: Never   Smokeless tobacco: Never  Vaping Use   Vaping status: Never Used  Substance and Sexual Activity   Alcohol use: No   Drug use: No   Sexual activity: Yes    Birth control/protection: None  Other Topics Concern   Not on file  Social History Narrative   Not on file   Social Drivers of Health   Financial Resource Strain: Not on file  Food Insecurity: Not on file  Transportation Needs: Not on file  Physical Activity: Not on file  Stress: Not on file  Social Connections: Not on file  Intimate Partner Violence: Not on file    Review of Systems:  All other review of systems negative except as mentioned in the HPI.  Physical Exam: Vital signs BP 138/85   Pulse (!) 58   Temp 98.8 F (37.1 C) (Temporal)   Ht 5\' 9"  (1.753 m)   Wt 229 lb 9.6 oz (104.1 kg)   LMP 05/15/2023   SpO2 100%   BMI 33.91 kg/m   General:   Alert,  Well-developed, well-nourished, pleasant and cooperative in NAD Airway:  Mallampati 2 Lungs:  Clear throughout  to auscultation.   Heart:  Regular rate and rhythm; no murmurs, clicks, rubs,  or gallops. Abdomen:  Soft, nontender and nondistended. Normal bowel sounds.   Neuro/Psych:  Normal mood and affect. A and O x 3   Derek Huneycutt E. Tomasa Rand, MD [facility]

## 2023-05-25 NOTE — Progress Notes (Signed)
 Vss nad trans to pacu

## 2023-05-25 NOTE — Patient Instructions (Signed)

## 2023-05-25 NOTE — Progress Notes (Signed)
 Pt's states no medical or surgical changes since previsit or office visit.

## 2023-05-26 ENCOUNTER — Telehealth: Payer: Self-pay

## 2023-05-26 NOTE — Telephone Encounter (Signed)
  Follow up Call-     05/25/2023    7:34 AM  Call back number  Post procedure Call Back phone  # [phone]  Permission to leave phone message Yes     Patient questions:  Do you have a fever, pain , or abdominal swelling? No. Pain Score  0 *  Have you tolerated food without any problems? Yes.    Have you been able to return to your normal activities? Yes.    Do you have any questions about your discharge instructions: Diet   No. Medications  No. Follow up visit  No.  Do you have questions or concerns about your Care? No.  Actions: * If pain score is 4 or above: No action needed, pain <4.

## 2023-05-29 ENCOUNTER — Encounter: Payer: Self-pay | Admitting: Gastroenterology

## 2023-05-29 LAB — SURGICAL PATHOLOGY

## 2023-05-29 NOTE — Progress Notes (Signed)
Meghan Fernandez,  The polyp which I removed during your recent procedure was proven to be completely benign but is considered a "pre-cancerous" polyp that MAY have grown into cancer if it had not been removed.  Studies shows that at least 20% of women over age 43 and 30% of men over age 31 have pre-cancerous polyps.  Based on current nationally recognized surveillance guidelines, I recommend that you have a repeat colonoscopy in 7 years.   If you develop any new rectal bleeding, abdominal pain or significant bowel habit changes, please contact me before then.

## 2023-06-13 ENCOUNTER — Encounter: Payer: Self-pay | Admitting: Nurse Practitioner

## 2023-06-22 ENCOUNTER — Telehealth: Payer: Self-pay | Admitting: Obstetrics and Gynecology

## 2023-06-22 DIAGNOSIS — N3281 Overactive bladder: Secondary | ICD-10-CM | POA: Diagnosis not present

## 2023-06-22 DIAGNOSIS — D259 Leiomyoma of uterus, unspecified: Secondary | ICD-10-CM | POA: Diagnosis not present

## 2023-06-22 DIAGNOSIS — D219 Benign neoplasm of connective and other soft tissue, unspecified: Secondary | ICD-10-CM

## 2023-06-22 NOTE — Progress Notes (Signed)
 TELEHEALTH GYNECOLOGY VISIT ENCOUNTER NOTE  Provider location: Center for [facility]   Patient location: Home  I connected with Meghan Fernandez on 06/22/23 at  9:35 AM EDT by MyChart audiovisual encounter  History:  Meghan Fernandez is a 43 y.o. [phone] with AUB related to fibroids and suspected overactive bladder  Has AUB related to fibroids - see note 3/21 for details. Has multiple fibroids on US , largest is 6.7cm and IM. We did EMB 3/21 that was benign. She also has OAB symptoms so opted for trial of myfembree  to help with AUB and shrink fibroids to see if OAB symptoms improve. Also planned for PFPT which is scheduled 6/16. Following with GI for chronic constipation - recent CSY 3/27 with tubular adenoma but no other abnormalities.   Presents today for follow up. In brief, stopped COC and then couldn't get myfembree  for ~ 1 week due to insurance issues. In that time she had withdrawal bleeding that was heavy and severely painful. Since starting myfembree  pain has nearly resolved and is reasonable for patient. Bleeding has also slowed, she had 4 days off but just started spotting again. Has noticed some hot flashes and mood swings. Bladder symptoms are about the same.   Detailed timeline: - Jan - COC birth control started. R breast discomfort No period until 3/17 - brown thourgh march 27th In that 10 days the fibroid pain started but wasn't bad. No ER pain  - Fibroid shifted, feeling more in pelvic area and stomach, one is lower than the other on the right side of the body causing numbness in the right leg, seemed to be aggravating a nerve.   - Stopped COC after our visit on 3/22. Had an issue with the myfembree  getting picked up until  On the 27th full blown red period and fibroid pain turned into ER level pain. Tried to ride it out because it was coming and going. In 2009 had similar episode but was constant.  Period on the 27th was extremely heavy and passing clots    - Started myfembree  on the 19-Aug-2023. ER pain had died down at that point. Still had pain but much more manageable.  Since starting myfembree  she has noticed mood swings, headaches and hot flashes. Pain has died down completely and is back to her normal. Feels a tiny bit here and there, acceptable to her. Bleeding through April 18th, had 4 days where she was filling a pad in an hour. Having a little bit of spotting that started on the 22nd.     Past Medical History:  Diagnosis Date   Allergy    Anemia    Asthma    childhood no inhaler now   External hemorrhoids with complication    Fibroids    History of asthma    childhood   History of chicken pox    Past Surgical History:  Procedure Laterality Date   COLONOSCOPY     The following portions of the patient's history were reviewed and updated as appropriate: allergies, current medications, past family history, past medical history, past social history, past surgical history and problem list.   Review of Systems:  Pertinent items noted in HPI and remainder of comprehensive ROS otherwise negative.  Physical Exam:   General:  Alert, oriented and cooperative.   Mental Status: Normal mood and affect perceived. Normal judgment and thought content.  Physical exam deferred due to nature of the encounter  Labs and Imaging No results found for  this or any previous visit (from the past 2 weeks). No results found.    Assessment and Plan:   Fibroids - Reviewed symptoms attributable to changes in medication but seem to be moving in the right direction - Continue myfembree  & follow up in 3 months  OAB (overactive bladder) - PFPT to start 6/16 - Offered trial of myrbetriq vs just seeing how bladder symptoms respond to myfembree  & PFPT - She would like to do stepwise approach - Follow up in 3 months - if feeling better OK for virtual, if not feeling better needs in person evaluation  Pt in agreement with plan. Advised to call/send message  with additional questions/concerns.   I provided 30 minutes of non-face-to-face time during this encounter.  Izell Marsh, MD Center for [employer]

## 2023-07-03 ENCOUNTER — Encounter: Payer: Self-pay | Admitting: Obstetrics and Gynecology

## 2023-07-30 ENCOUNTER — Other Ambulatory Visit: Payer: Self-pay | Admitting: Nurse Practitioner

## 2023-07-31 ENCOUNTER — Telehealth: Payer: Self-pay

## 2023-07-31 NOTE — Telephone Encounter (Signed)
 Copied from CRM [id number]. Topic: Clinical - Request for Lab/Test Order >> Jul 31, 2023 11:41 AM Howard Macho wrote: Reason for CRM: patient called stating she would like to do a self swab for bacterial vaginitis because she keeps getting it and her obgyn is trying to narrow down what the problem is. The patient obyn stated the patient can just come in at get a swab and leave CB [phone]

## 2023-07-31 NOTE — Telephone Encounter (Signed)
 I called and spoke with patient and advised her that she will need to be seen or can get done at her OBGYN office. Patient refuses to go to OBGYN due to the time it takes to get there and back from work. I scheduled patient for an appointment with Lauren on Wednesday.

## 2023-07-31 NOTE — Telephone Encounter (Signed)
 Left message for patient to return call.

## 2023-08-02 ENCOUNTER — Ambulatory Visit: Admitting: Nurse Practitioner

## 2023-08-14 ENCOUNTER — Ambulatory Visit: Attending: Obstetrics and Gynecology

## 2023-08-14 ENCOUNTER — Other Ambulatory Visit (HOSPITAL_COMMUNITY)
Admission: RE | Admit: 2023-08-14 | Discharge: 2023-08-14 | Disposition: A | Discharge status: Home / Self Care | Source: Ambulatory Visit | Attending: Obstetrics and Gynecology | Admitting: Obstetrics and Gynecology

## 2023-08-14 ENCOUNTER — Ambulatory Visit: Admitting: Nurse Practitioner

## 2023-08-14 ENCOUNTER — Other Ambulatory Visit: Payer: Self-pay

## 2023-08-14 ENCOUNTER — Ambulatory Visit: Admitting: *Deleted

## 2023-08-14 VITALS — BP 143/89 | HR 56

## 2023-08-14 DIAGNOSIS — M6281 Muscle weakness (generalized): Secondary | ICD-10-CM | POA: Insufficient documentation

## 2023-08-14 DIAGNOSIS — R279 Unspecified lack of coordination: Secondary | ICD-10-CM | POA: Insufficient documentation

## 2023-08-14 DIAGNOSIS — M62838 Other muscle spasm: Secondary | ICD-10-CM | POA: Insufficient documentation

## 2023-08-14 DIAGNOSIS — R293 Abnormal posture: Secondary | ICD-10-CM | POA: Insufficient documentation

## 2023-08-14 DIAGNOSIS — N898 Other specified noninflammatory disorders of vagina: Secondary | ICD-10-CM

## 2023-08-14 NOTE — Therapy (Signed)
 OUTPATIENT PHYSICAL THERAPY FEMALE PELVIC EVALUATION   Patient Name: Meghan Fernandez MRN: [id number] DOB:[birth year], 43 y.o., female Today's Date: 08/14/2023  END OF SESSION:  PT End of Session - 08/14/23 0759     Visit Number 1    Date for PT Re-Evaluation 01/29/24    Authorization Type [employer]    Authorization Time Period waiting    PT Start Time 0800    PT Stop Time 0840    PT Time Calculation (min) 40 min    Activity Tolerance Patient tolerated treatment well    Behavior During Therapy [facility] for tasks assessed/performed          Past Medical History:  Diagnosis Date   Allergy    Anemia    Asthma    childhood no inhaler now   External hemorrhoids with complication    Fibroids    History of asthma    childhood   History of chicken pox    Past Surgical History:  Procedure Laterality Date   COLONOSCOPY     Patient Active Problem List   Diagnosis Date Noted   Pure hypercholesterolemia 03/14/2023   Abnormal uterine bleeding 03/14/2023   Morbid obesity (HCC) 08/31/2022   Vitamin D  deficiency 03/10/2022   Environmental allergies 02/02/2022   Obesity (BMI 30-39.9) 02/02/2022   Chronic constipation 02/02/2022   Anemia 02/02/2022   Insomnia 02/02/2022   Menorrhagia with regular cycle 02/02/2022   Urinary frequency 02/02/2022    PCP: Odette Benjamin, NP  REFERRING PROVIDER: Izell Marsh, MD   REFERRING DIAG: N32.81 (ICD-10-CM) - OAB (overactive bladder)  THERAPY DIAG:  Abnormal posture - Plan: PT plan of care cert/re-cert  Muscle weakness (generalized) - Plan: PT plan of care cert/re-cert  Other muscle spasm - Plan: PT plan of care cert/re-cert  Unspecified lack of coordination - Plan: PT plan of care cert/re-cert  Rationale for Evaluation and Treatment: Rehabilitation  ONSET DATE: over a year  SUBJECTIVE:                                                                                                                                                                                            SUBJECTIVE STATEMENT: She states that she has history of holding bladder at work and would have accidents. Pt is currently on medication for fibroids that is causing a lot of bloating.    PAIN:  Are you having pain? Yes NPRS scale: 4/10 Pain location: low back pain (2021 started)  Pain type: aching and stiff/spasm Pain description: intermittent   Aggravating factors: bending Relieving factors: standing and moving   PRECAUTIONS: None  RED FLAGS:  None   WEIGHT BEARING RESTRICTIONS: No  FALLS:  Has patient fallen in last 6 months? No  OCCUPATION: office job  ACTIVITY LEVEL : no current exercise   PLOF: Independent  PATIENT GOALS: to improve bladder control   PERTINENT HISTORY:  External hemorrhoids, fibroids, history of asthma, chronic constipation,   BOWEL MOVEMENT: Pain with bowel movement: Yes Type of bowel movement:Type (Bristol Stool Scale) 4 since she is on medication, Frequency 1x/week, and Strain yes - will manually have to get bowel movement out Fully empty rectum: No Leakage: No Pads: Yes: see below Fiber supplement/laxative Yes - Amitiza   URINATION: Pain with urination: No Fully empty bladder: No Stream: variety - sometimes will just drip out, sometimes will be strong Urgency: Yes  Frequency: depends on water intake - sometimes she has to go every 5 minutes; at night she goes 3-4x/night Fluid Intake: working on drinking more water - sometimes one bottle will send her to the bathroom many times  Leakage: Urge to void, Walking to the bathroom, Coughing, Sneezing, and Laughing (if she already has to pee) Pads: Yes: every day - 3 pads - smooth move occasionally  INTERCOURSE:  Not currently sexually active  PREGNANCY: Vaginal deliveries 2 Tearing Yes: small tears  Episiotomy No C-section deliveries 0 Currently pregnant No  PROLAPSE: None   OBJECTIVE:  Note: Objective measures were completed at  Evaluation unless otherwise noted.   PATIENT SURVEYS:   PFIQ-7: 58  COGNITION: Overall cognitive status: Within functional limits for tasks assessed     SENSATION: Light touch: Appears intact   FUNCTIONAL TESTS:  Squat: Lt weight shift Single leg stance:  Rt: pelvic drop  Lt: pelvic drop Curl-up test: abdominal disotrtion   GAIT: Assistive device utilized: None Comments: WNL  POSTURE: rounded shoulders and forward head   LUMBARAROM/PROM: WNL   PALPATION:   General: tightness throughout low back  Pelvic Alignment: Lt posterior rotation  Abdominal: good rib cage mobility; significant tightness/distention of abdomen - patient reports that firmness palpated is what shefeels like is her fibroids                External Perineal Exam: WNL                             Internal Pelvic Floor: no tenderness  Patient confirms identification and approves PT to assess internal pelvic floor and treatment Yes  PELVIC MMT:   MMT eval  Vaginal 4/5, 12 seconds, 6 repeat contractions; coordination poor with tendency to bear down instead of drawing in  Diastasis Recti 2 finger width separation  (Blank rows = not tested)        TONE: WNL  PROLAPSE: Grade 1 posterior wall/grade 2 anterior wall laxity (morning tested in supine)  TODAY'S TREATMENT:  DATE:  07/05/23  EVAL  Neuromuscular re-education: Pt provides verbal consent for internal vaginal/rectal pelvic floor exam. Internal vaginal pelvic floor muscle contraction training Quick flicks Long holds  Urge drill Therapeutic activities: Squatty potty Splinting    PATIENT EDUCATION:  Education details: See above Person educated: Patient Education method: Programmer, multimedia, Demonstration, Tactile cues, Verbal cues, and Handouts Education comprehension: verbalized understanding  HOME EXERCISE  PROGRAM: [email or web address]  ASSESSMENT:  CLINICAL IMPRESSION: Patient is a 43 y.o. female who was seen today for physical therapy evaluation and treatment for urinary urgency and incontinence. Exam findings are notable for decreased lumbar A/ROM, abnormal posture/pelvic rotation, pelvic drop in single leg stance, core weakness with abdominal distortion and 2 finger width separation, significant firmness/restriction in lower abdomen that was tight and painful, anterior/posterior vaginal wall laxity, decreased pelvic floor muscle coordination with tendency to bear down. Signs and symptoms are most consistent with chronic constipation, poor coordination of pelvic floor muscle contraction, vaginal wall laxity; it is likely that pressure from stool burden and fibroids are placing pressure on bladder that could be contributing to urgency. However, there are body impairments and poor bladder habits that can be corrected and will likely help patient to see improvement. Initial treatment consisted of pelvic floor muscle contraction training with breath coordination, urge drill, splinting, and use of squatty potty. She will continue to benefit from skilled PT intervention in order to decrease urinary urgency/incontinence, address all impairments, improve chronic constipation, and begin/progress functional strengthening program.   OBJECTIVE IMPAIRMENTS: decreased activity tolerance, decreased coordination, decreased endurance, decreased mobility, decreased ROM, decreased strength, increased fascial restrictions, increased muscle spasms, impaired flexibility, impaired tone, improper body mechanics, postural dysfunction, and pain.   ACTIVITY LIMITATIONS: bending, sleeping, and continence  PARTICIPATION LIMITATIONS: cleaning, laundry, community activity, and occupation  PERSONAL FACTORS: 1 comorbidity: medical history are also affecting patient's functional outcome.   REHAB POTENTIAL: Good  CLINICAL DECISION MAKING:  Stable/uncomplicated  EVALUATION COMPLEXITY: Low   GOALS: Goals reviewed with patient? Yes  SHORT TERM GOALS: Target date: 09/11/2023   Pt will be independent with HEP.   Baseline: Goal status: INITIAL  2.  Pt will report low back pain no greater than 2/10 in order to perform household cleaning and laundry without pain.  Baseline:  Goal status: INITIAL  3.  Pt will be independent with the knack, urge suppression technique, and double voiding in order to improve bladder habits and decrease urinary incontinence.   Baseline:  Goal status: INITIAL  4.  Pt will report 25% improvement in urinary urgency in order to have more time to focus on work, household chores, and hobbies.  Baseline:  Goal status: INITIAL  5.  Pt will be independent with use of squatty potty, relaxed toileting mechanics, and improved bowel movement techniques in order to increase ease of bowel movements and complete evacuation.   Baseline:  Goal status: INITIAL  6.  Pt will be independent with use of splinting in order to help improve ease of bowel movements.  Baseline:  Goal status: INITIAL  LONG TERM GOALS: Target date: 01/29/24  Pt will be independent with advanced HEP.   Baseline:  Goal status: INITIAL  2.  Pt will be able to go 2-3 hours in between voids without urgency or incontinence in order to improve QOL and perform all functional activities with less difficulty.   Baseline:  Goal status: INITIAL  3.  Pt will report use of no more than 1 pad a day for urinary incontinence in order to  demonstrate improvements with incontinence and decrease risk of infection.  Baseline:  Goal status: INITIAL  4.  Pt will report 75% improvement in pain in order to play with kids and do household chores without difficulty.  Baseline:  Goal status: INITIAL  5.  Pt will be able to perform single leg stance without pelvic drop in order to improve core stability to help with urinary incontinence and decrease  low back pain.  Baseline:  Goal status: INITIAL  6.  Pt will have at least 4 bowel movements a week in order to decrease pressure on bladder to allow for less urinary urgency.  Baseline:  Goal status: INITIAL  PLAN:  PT FREQUENCY: 1-2x/week  PT DURATION: 6 months  PLANNED INTERVENTIONS: 97110-Therapeutic exercises, 97530- Therapeutic activity, 97112- Neuromuscular re-education, 97535- Self Care, 29562- Manual therapy, Dry Needling, and Biofeedback  PLAN FOR NEXT SESSION: Inverted lying position; self-abdominal massage; official voiding schedule - possibly give bowel/bladder journal; start mobility/core training  Verlena Glenn, PT, DPT06/16/253:32 PM

## 2023-08-14 NOTE — Progress Notes (Signed)
 Concern for recurrent vaginal infections despite following MD recommendations.Has AUB d/t uterine fibroid.   SUBJECTIVE:  43 y.o. female complains of  vaginal itching for 1 week(s). Denies vaginal discharge. Denies abnormal vaginal bleeding or significant pelvic pain or fever. No UTI symptoms. Denies history of known exposure to STD.  No LMP recorded.  OBJECTIVE:  She appears well, afebrile. Urine dipstick: not done.  ASSESSMENT:  Vaginal Discharge  Vaginal Odor   PLAN:  GC, chlamydia, trichomonas, BVAG, CVAG probe sent to lab. Treatment: To be determined once lab results are received ROV prn if symptoms persist or worsen.

## 2023-08-14 NOTE — Patient Instructions (Signed)
 Urge Incontinence  Ideal urination frequency is every 2-4 wakeful hours, which equates to 5-8 times within a 24-hour period.   Urge incontinence is leakage that occurs when the bladder muscle contracts, creating a sudden need to go before getting to the bathroom.   Going too often when your bladder isn't actually full can disrupt the body's automatic signals to store and hold urine longer, which will increase urgency/frequency.  In this case, the bladder "is running the show" and strategies can be learned to retrain this pattern.   One should be able to control the first urge to urinate, at around .  The bladder can hold up to a "grande latte," or . To help you gain control, practice the Urge Drill below when urgency strikes.  This drill will help retrain your bladder signals and allow you to store and hold urine longer.  The overall goal is to stretch out your time between voids to reach a more manageable voiding schedule.    Practice your "quick flicks" often throughout the day (each waking hour) even when you don't need feel the urge to go.  This will help strengthen your pelvic floor muscles, making them more effective in controlling leakage.  Urge Drill  When you feel an urge to go, follow these steps to regain control: Stop what you are doing and be still Take one deep breath, directing your air into your abdomen Think an affirming thought, such as "I've got this." Do 5 quick flicks of your pelvic floor Walk with control to the bathroom to void, or delay voiding  [facility] [address], [address] [place], [place] [address] Phone # [phone] Fax [phone]

## 2023-08-15 LAB — CERVICOVAGINAL ANCILLARY ONLY
Bacterial Vaginitis (gardnerella): NEGATIVE
Candida Glabrata: NEGATIVE
Candida Vaginitis: NEGATIVE
Chlamydia: NEGATIVE
Comment: NEGATIVE
Comment: NEGATIVE
Comment: NEGATIVE
Comment: NEGATIVE
Comment: NEGATIVE
Comment: NORMAL
Neisseria Gonorrhea: NEGATIVE
Trichomonas: NEGATIVE

## 2023-08-23 ENCOUNTER — Ambulatory Visit: Payer: Self-pay

## 2023-08-23 DIAGNOSIS — R279 Unspecified lack of coordination: Secondary | ICD-10-CM | POA: Diagnosis not present

## 2023-08-23 DIAGNOSIS — M6281 Muscle weakness (generalized): Secondary | ICD-10-CM

## 2023-08-23 DIAGNOSIS — R293 Abnormal posture: Secondary | ICD-10-CM | POA: Diagnosis not present

## 2023-08-23 DIAGNOSIS — M62838 Other muscle spasm: Secondary | ICD-10-CM | POA: Diagnosis not present

## 2023-08-23 NOTE — Patient Instructions (Addendum)
 Bladder retraining:  Drink 4-8oz an hour - SIP ONLY WATER Stop water intake 3 hours before bed Try to go at least 2-3 hours between trips to the bathroom - go whether you need to or not.  For two weeks.   Bowel retraining:  Try to have bowel movement after each meal within 30 minutes Do not stay on the toilet longer than 5 minutes Make sure you're getting feet up and leaning backwards Just creating opportunity
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
## 2023-08-24 ENCOUNTER — Encounter: Payer: Self-pay | Admitting: Nurse Practitioner

## 2023-08-28 ENCOUNTER — Ambulatory Visit: Payer: Self-pay

## 2023-08-28 DIAGNOSIS — M6281 Muscle weakness (generalized): Secondary | ICD-10-CM

## 2023-08-28 DIAGNOSIS — M62838 Other muscle spasm: Secondary | ICD-10-CM

## 2023-08-28 DIAGNOSIS — R279 Unspecified lack of coordination: Secondary | ICD-10-CM

## 2023-08-28 DIAGNOSIS — R293 Abnormal posture: Secondary | ICD-10-CM

## 2023-08-28 NOTE — Therapy (Signed)
 OUTPATIENT PHYSICAL THERAPY FEMALE PELVIC TREATMENT   Patient Name: Meghan Fernandez MRN: [id number] DOB:04-17-1980, 43 y.o., female Today's Date: 08/28/2023  END OF SESSION:  PT End of Session - 08/28/23 0846     Visit Number 3    Date for PT Re-Evaluation 01/29/24    Authorization Type [employer]    Authorization Time Period 08/14/2023-10/12/2023    Authorization - Visit Number 2    Authorization - Number of Visits 5    PT Start Time 0845    PT Stop Time 0925    PT Time Calculation (min) 40 min    Activity Tolerance Patient tolerated treatment well    Behavior During Therapy [facility] for tasks assessed/performed           Past Medical History:  Diagnosis Date   Allergy    Anemia    Asthma    childhood no inhaler now   External hemorrhoids with complication    Fibroids    History of asthma    childhood   History of chicken pox    Past Surgical History:  Procedure Laterality Date   COLONOSCOPY     Patient Active Problem List   Diagnosis Date Noted   Pure hypercholesterolemia 03/14/2023   Abnormal uterine bleeding 03/14/2023   Morbid obesity (HCC) 08/31/2022   Vitamin D  deficiency 03/10/2022   Environmental allergies 02/02/2022   Obesity (BMI 30-39.9) 02/02/2022   Chronic constipation 02/02/2022   Anemia 02/02/2022   Insomnia 02/02/2022   Menorrhagia with regular cycle 02/02/2022   Urinary frequency 02/02/2022    PCP: Nedra Tinnie DELENA, NP  REFERRING PROVIDER: Erik Kieth BROCKS, MD   REFERRING DIAG: N32.81 (ICD-10-CM) - OAB (overactive bladder)  THERAPY DIAG:  Abnormal posture  Muscle weakness (generalized)  Other muscle spasm  Unspecified lack of coordination  Rationale for Evaluation and Treatment: Rehabilitation  ONSET DATE: over a year  SUBJECTIVE:                                                                                                                                                                                            SUBJECTIVE STATEMENT: Pt states that she is doing a little bit better. She has been able to mostly stick with a 2 hour voiding schedule with urination. She has been having slightly more bowel movements (states 3 out of 10 attempts). She is working on eating at least 3 times a day and going to the bathroom for attempts.    PAIN: 08/28/23 Are you having pain? Yes NPRS scale: 4/10 Pain location: low back pain (2021 started)  Pain type: aching and stiff/spasm Pain description:  intermittent   Aggravating factors: bending Relieving factors: standing and moving   PRECAUTIONS: None  RED FLAGS: None   WEIGHT BEARING RESTRICTIONS: No  FALLS:  Has patient fallen in last 6 months? No  OCCUPATION: office job  ACTIVITY LEVEL : no current exercise   PLOF: Independent  PATIENT GOALS: to improve bladder control   PERTINENT HISTORY:  External hemorrhoids, fibroids, history of asthma, chronic constipation,   BOWEL MOVEMENT: Pain with bowel movement: Yes Type of bowel movement:Type (Bristol Stool Scale) 4 since she is on medication, Frequency 1x/week, and Strain yes - will manually have to get bowel movement out Fully empty rectum: No Leakage: No Pads: Yes: see below Fiber supplement/laxative Yes - Amitiza   URINATION: Pain with urination: No Fully empty bladder: No Stream: variety - sometimes will just drip out, sometimes will be strong Urgency: Yes  Frequency: depends on water intake - sometimes she has to go every 5 minutes; at night she goes 3-4x/night Fluid Intake: working on drinking more water - sometimes one bottle will send her to the bathroom many times  Leakage: Urge to void, Walking to the bathroom, Coughing, Sneezing, and Laughing (if she already has to pee) Pads: Yes: every day - 3 pads - smooth move occasionally  INTERCOURSE:  Not currently sexually active  PREGNANCY: Vaginal deliveries 2 Tearing Yes: small tears  Episiotomy No C-section deliveries  0 Currently pregnant No  PROLAPSE: None   OBJECTIVE:  Note: Objective measures were completed at Evaluation unless otherwise noted.   PATIENT SURVEYS:   PFIQ-7: 71  COGNITION: Overall cognitive status: Within functional limits for tasks assessed     SENSATION: Light touch: Appears intact   FUNCTIONAL TESTS:  Squat: Lt weight shift Single leg stance:  Rt: pelvic drop  Lt: pelvic drop Curl-up test: abdominal disotrtion   GAIT: Assistive device utilized: None Comments: WNL  POSTURE: rounded shoulders and forward head   LUMBARAROM/PROM: WNL   PALPATION:   General: tightness throughout low back  Pelvic Alignment: Lt posterior rotation  Abdominal: good rib cage mobility; significant tightness/distention of abdomen - patient reports that firmness palpated is what shefeels like is her fibroids                External Perineal Exam: WNL                             Internal Pelvic Floor: no tenderness  Patient confirms identification and approves PT to assess internal pelvic floor and treatment Yes  PELVIC MMT:   MMT eval  Vaginal 4/5, 12 seconds, 6 repeat contractions; coordination poor with tendency to bear down instead of drawing in  Diastasis Recti 2 finger width separation  (Blank rows = not tested)        TONE: WNL  PROLAPSE: Grade 1 posterior wall/grade 2 anterior wall laxity (morning tested in supine)  TODAY'S TREATMENT:  DATE:  08/28/23 Manual: Bowel mobilization  Ileocecal valve mobilization ILU massage Therapeutic activities: Review of splinting Training on self-bowel massage 150 minutes moderate intensity exercise a week   08/23/23  Therapeutic activities: Voiding schedule  Review of urge drill Bladder irritants Increasing water intake Increasing food/fiber intake vs just doing green tea and protein shakes in  order to help stimulate digestion Bowel/bladder retraining  Using finger vaginally for feedback on if she is performing contractions correctly   08/14/23  EVAL  Neuromuscular re-education: Pt provides verbal consent for internal vaginal/rectal pelvic floor exam. Internal vaginal pelvic floor muscle contraction training Quick flicks Long holds  Urge drill Therapeutic activities: Squatty potty Splinting    PATIENT EDUCATION:  Education details: See above Person educated: Patient Education method: Programmer, multimedia, Demonstration, Actor cues, Verbal cues, and Handouts Education comprehension: verbalized understanding  HOME EXERCISE PROGRAM: [email or web address]  ASSESSMENT:  CLINICAL IMPRESSION: Pt has seen some improvements with bowel movements and urgency over the course of the last 5 days. However, she still is struggling with urinary urgency and bowel movements. We went over splinting again and practiced on squatty potty. She was taught how to perform home self bowel mobilization. Manual techniques utilized in session today to help improve GI motility with good tolerance. She will continue to benefit from skilled PT intervention in order to decrease urinary urgency/incontinence, address all impairments, improve chronic constipation, and begin/progress functional strengthening program.   OBJECTIVE IMPAIRMENTS: decreased activity tolerance, decreased coordination, decreased endurance, decreased mobility, decreased ROM, decreased strength, increased fascial restrictions, increased muscle spasms, impaired flexibility, impaired tone, improper body mechanics, postural dysfunction, and pain.   ACTIVITY LIMITATIONS: bending, sleeping, and continence  PARTICIPATION LIMITATIONS: cleaning, laundry, community activity, and occupation  PERSONAL FACTORS: 1 comorbidity: medical history are also affecting patient's functional outcome.   REHAB POTENTIAL: Good  CLINICAL DECISION MAKING:  Stable/uncomplicated  EVALUATION COMPLEXITY: Low   GOALS: Goals reviewed with patient? Yes  SHORT TERM GOALS: Target date: 09/11/2023   Pt will be independent with HEP.   Baseline: Goal status: INITIAL  2.  Pt will report low back pain no greater than 2/10 in order to perform household cleaning and laundry without pain.  Baseline:  Goal status: INITIAL  3.  Pt will be independent with the knack, urge suppression technique, and double voiding in order to improve bladder habits and decrease urinary incontinence.   Baseline:  Goal status: INITIAL  4.  Pt will report 25% improvement in urinary urgency in order to have more time to focus on work, household chores, and hobbies.  Baseline:  Goal status: INITIAL  5.  Pt will be independent with use of squatty potty, relaxed toileting mechanics, and improved bowel movement techniques in order to increase ease of bowel movements and complete evacuation.   Baseline:  Goal status: INITIAL  6.  Pt will be independent with use of splinting in order to help improve ease of bowel movements.  Baseline:  Goal status: INITIAL  LONG TERM GOALS: Target date: 01/29/24  Pt will be independent with advanced HEP.   Baseline:  Goal status: INITIAL  2.  Pt will be able to go 2-3 hours in between voids without urgency or incontinence in order to improve QOL and perform all functional activities with less difficulty.   Baseline:  Goal status: INITIAL  3.  Pt will report use of no more than 1 pad a day for urinary incontinence in order to demonstrate improvements with incontinence and decrease risk of infection.  Baseline:  Goal status: INITIAL  4.  Pt will report 75% improvement in pain in order to play with kids and do household chores without difficulty.  Baseline:  Goal status: INITIAL  5.  Pt will be able to perform single leg stance without pelvic drop in order to improve core stability to help with urinary incontinence and decrease  low back pain.  Baseline:  Goal status: INITIAL  6.  Pt will have at least 4 bowel movements a week in order to decrease pressure on bladder to allow for less urinary urgency.  Baseline:  Goal status: INITIAL  PLAN:  PT FREQUENCY: 1-2x/week  PT DURATION: 6 months  PLANNED INTERVENTIONS: 97110-Therapeutic exercises, 97530- Therapeutic activity, 97112- Neuromuscular re-education, 97535- Self Care, [id number]- Manual therapy, Dry Needling, and Biofeedback  PLAN FOR NEXT SESSION: Inverted lying position; self-abdominal massage; official voiding schedule - possibly give bowel/bladder journal; start mobility/core training; review pelvic floor muscle contractions for correct performance  Josette Mares, PT, DPT06/30/2510:01 AM

## 2023-08-28 NOTE — Patient Instructions (Signed)
 YouTube: Heather  [facility] [employer]  Bowel massage: To assist with more regular and more comfortable bowel movements, try performing bowel massage nightly for 5-10 minutes. Place hands in the lower right side of your abdomen to start; in small circles, massage up, across, and down the left side of your abdomen. Pressure does not need to be hard, but just comfortable. You can use lotion or oil to make more comfortable.     The first picture shows that there is no effect on the pelvic floor with gravity eliminated. The next three show that with a wedge pillow or a few pillows from home under your pelvis the pelvic floor is inverted and may relax and allows gravity to help return prolapsed areas more inward to help relieve symptoms. Do this 15-20 mins every evening when symptoms tend to be worse. Stop if you have pain or negative symptoms.    [facility] [address] [place], [place] [address] Phone # [phone] Fax [phone]

## 2023-09-05 ENCOUNTER — Ambulatory Visit: Payer: Self-pay | Attending: Obstetrics and Gynecology

## 2023-09-05 ENCOUNTER — Telehealth: Payer: Self-pay | Admitting: Gastroenterology

## 2023-09-05 DIAGNOSIS — R279 Unspecified lack of coordination: Secondary | ICD-10-CM | POA: Insufficient documentation

## 2023-09-05 DIAGNOSIS — M6281 Muscle weakness (generalized): Secondary | ICD-10-CM | POA: Diagnosis not present

## 2023-09-05 DIAGNOSIS — M62838 Other muscle spasm: Secondary | ICD-10-CM | POA: Diagnosis not present

## 2023-09-05 DIAGNOSIS — R293 Abnormal posture: Secondary | ICD-10-CM | POA: Insufficient documentation

## 2023-09-05 NOTE — Telephone Encounter (Signed)
 Left message for patient to return my call.

## 2023-09-05 NOTE — Therapy (Signed)
 OUTPATIENT PHYSICAL THERAPY FEMALE PELVIC TREATMENT   Patient Name: Meghan Fernandez MRN: [id number] DOB:07/31/1980, 43 y.o., female Today's Date: 09/05/2023  END OF SESSION:  PT End of Session - 09/05/23 0801     Visit Number 4    Date for PT Re-Evaluation 01/29/24    Authorization Type [employer]    Authorization Time Period 08/14/2023-10/12/2023    Authorization - Visit Number 3    Authorization - Number of Visits 5    PT Start Time 0800    PT Stop Time 0842    PT Time Calculation (min) 42 min    Activity Tolerance Patient tolerated treatment well    Behavior During Therapy [facility] for tasks assessed/performed           Past Medical History:  Diagnosis Date   Allergy    Anemia    Asthma    childhood no inhaler now   External hemorrhoids with complication    Fibroids    History of asthma    childhood   History of chicken pox    Past Surgical History:  Procedure Laterality Date   COLONOSCOPY     Patient Active Problem List   Diagnosis Date Noted   Pure hypercholesterolemia 03/14/2023   Abnormal uterine bleeding 03/14/2023   Morbid obesity (HCC) 08/31/2022   Vitamin D  deficiency 03/10/2022   Environmental allergies 02/02/2022   Obesity (BMI 30-39.9) 02/02/2022   Chronic constipation 02/02/2022   Anemia 02/02/2022   Insomnia 02/02/2022   Menorrhagia with regular cycle 02/02/2022   Urinary frequency 02/02/2022    PCP: Nedra Tinnie DELENA, NP  REFERRING PROVIDER: Erik Kieth BROCKS, MD   REFERRING DIAG: N32.81 (ICD-10-CM) - OAB (overactive bladder)  THERAPY DIAG:  Abnormal posture  Muscle weakness (generalized)  Other muscle spasm  Unspecified lack of coordination  Rationale for Evaluation and Treatment: Rehabilitation  ONSET DATE: over a year  SUBJECTIVE:                                                                                                                                                                                           SUBJECTIVE  STATEMENT: Pt just started doing iron every other day. She states that only staying on the toilet 5 minutes was initially good, but then stopped working. So she used senna. She feels like she is doing her bowel mobilization wrong. Pt walked 4 days last week.    PAIN: 08/28/23 Are you having pain? Yes NPRS scale: 4/10 Pain location: low back pain (2021 started)  Pain type: aching and stiff/spasm Pain description: intermittent   Aggravating factors: bending Relieving factors: standing and moving  PRECAUTIONS: None  RED FLAGS: None   WEIGHT BEARING RESTRICTIONS: No  FALLS:  Has patient fallen in last 6 months? No  OCCUPATION: office job  ACTIVITY LEVEL : no current exercise   PLOF: Independent  PATIENT GOALS: to improve bladder control   PERTINENT HISTORY:  External hemorrhoids, fibroids, history of asthma, chronic constipation,   BOWEL MOVEMENT: Pain with bowel movement: Yes Type of bowel movement:Type (Bristol Stool Scale) 4 since she is on medication, Frequency 1x/week, and Strain yes - will manually have to get bowel movement out Fully empty rectum: No Leakage: No Pads: Yes: see below Fiber supplement/laxative Yes - Amitiza   URINATION: Pain with urination: No Fully empty bladder: No Stream: variety - sometimes will just drip out, sometimes will be strong Urgency: Yes  Frequency: depends on water intake - sometimes she has to go every 5 minutes; at night she goes 3-4x/night Fluid Intake: working on drinking more water - sometimes one bottle will send her to the bathroom many times  Leakage: Urge to void, Walking to the bathroom, Coughing, Sneezing, and Laughing (if she already has to pee) Pads: Yes: every day - 3 pads - smooth move occasionally  INTERCOURSE:  Not currently sexually active  PREGNANCY: Vaginal deliveries 2 Tearing Yes: small tears  Episiotomy No C-section deliveries 0 Currently pregnant No  PROLAPSE: None   OBJECTIVE:  Note:  Objective measures were completed at Evaluation unless otherwise noted.   PATIENT SURVEYS:   PFIQ-7: 51  COGNITION: Overall cognitive status: Within functional limits for tasks assessed     SENSATION: Light touch: Appears intact   FUNCTIONAL TESTS:  Squat: Lt weight shift Single leg stance:  Rt: pelvic drop  Lt: pelvic drop Curl-up test: abdominal disotrtion   GAIT: Assistive device utilized: None Comments: WNL  POSTURE: rounded shoulders and forward head   LUMBARAROM/PROM: WNL   PALPATION:   General: tightness throughout low back  Pelvic Alignment: Lt posterior rotation  Abdominal: good rib cage mobility; significant tightness/distention of abdomen - patient reports that firmness palpated is what shefeels like is her fibroids                External Perineal Exam: WNL                             Internal Pelvic Floor: no tenderness  Patient confirms identification and approves PT to assess internal pelvic floor and treatment Yes  PELVIC MMT:   MMT eval  Vaginal 4/5, 12 seconds, 6 repeat contractions; coordination poor with tendency to bear down instead of drawing in  Diastasis Recti 2 finger width separation  (Blank rows = not tested)        TONE: WNL  PROLAPSE: Grade 1 posterior wall/grade 2 anterior wall laxity (morning tested in supine)  TODAY'S TREATMENT:  DATE:  09/05/23 Manual: Bowel mobilization  Ileocecal valve mobilization ILU massage Exercises: Lower trunk rotation 2 x 10 Modified thomas stretch 60 sec bil Bridge with hip adduction 2 x 10 Therapeutic activities: Revisited fiber rich foods and incorporating more in her meals    08/28/23 Manual: Bowel mobilization  Ileocecal valve mobilization ILU massage Therapeutic activities: Review of splinting Training on self-bowel massage 150 minutes moderate  intensity exercise a week   08/23/23  Therapeutic activities: Voiding schedule  Review of urge drill Bladder irritants Increasing water intake Increasing food/fiber intake vs just doing green tea and protein shakes in order to help stimulate digestion Bowel/bladder retraining  Using finger vaginally for feedback on if she is performing contractions correctly    PATIENT EDUCATION:  Education details: See above Person educated: Patient Education method: Explanation, Demonstration, Tactile cues, Verbal cues, and Handouts Education comprehension: verbalized understanding  HOME EXERCISE PROGRAM: [email or web address]  ASSESSMENT:  CLINICAL IMPRESSION: Pt had a more difficult week with bowel movements and has been much more constipated. She is still having difficulty getting good fiber into diet. We discussed ways that might help to improve this. She did well with manual techniques to abdomen with increased gut sounds noted. She was able to perform some mobility and strengthening activities to begin to help stimulate more motility and provide improved pelvic support. She will continue to benefit from skilled PT intervention in order to decrease urinary urgency/incontinence, address all impairments, improve chronic constipation, and begin/progress functional strengthening program.   OBJECTIVE IMPAIRMENTS: decreased activity tolerance, decreased coordination, decreased endurance, decreased mobility, decreased ROM, decreased strength, increased fascial restrictions, increased muscle spasms, impaired flexibility, impaired tone, improper body mechanics, postural dysfunction, and pain.   ACTIVITY LIMITATIONS: bending, sleeping, and continence  PARTICIPATION LIMITATIONS: cleaning, laundry, community activity, and occupation  PERSONAL FACTORS: 1 comorbidity: medical history are also affecting patient's functional outcome.   REHAB POTENTIAL: Good  CLINICAL DECISION MAKING:  Stable/uncomplicated  EVALUATION COMPLEXITY: Low   GOALS: Goals reviewed with patient? Yes  SHORT TERM GOALS: Target date: 09/11/2023   Pt will be independent with HEP.   Baseline: Goal status: INITIAL  2.  Pt will report low back pain no greater than 2/10 in order to perform household cleaning and laundry without pain.  Baseline:  Goal status: INITIAL  3.  Pt will be independent with the knack, urge suppression technique, and double voiding in order to improve bladder habits and decrease urinary incontinence.   Baseline:  Goal status: INITIAL  4.  Pt will report 25% improvement in urinary urgency in order to have more time to focus on work, household chores, and hobbies.  Baseline:  Goal status: INITIAL  5.  Pt will be independent with use of squatty potty, relaxed toileting mechanics, and improved bowel movement techniques in order to increase ease of bowel movements and complete evacuation.   Baseline:  Goal status: INITIAL  6.  Pt will be independent with use of splinting in order to help improve ease of bowel movements.  Baseline:  Goal status: INITIAL  LONG TERM GOALS: Target date: 01/29/24  Pt will be independent with advanced HEP.   Baseline:  Goal status: INITIAL  2.  Pt will be able to go 2-3 hours in between voids without urgency or incontinence in order to improve QOL and perform all functional activities with less difficulty.   Baseline:  Goal status: INITIAL  3.  Pt will report use of no more than 1 pad a day for urinary incontinence in  order to demonstrate improvements with incontinence and decrease risk of infection.  Baseline:  Goal status: INITIAL  4.  Pt will report 75% improvement in pain in order to play with kids and do household chores without difficulty.  Baseline:  Goal status: INITIAL  5.  Pt will be able to perform single leg stance without pelvic drop in order to improve core stability to help with urinary incontinence and decrease  low back pain.  Baseline:  Goal status: INITIAL  6.  Pt will have at least 4 bowel movements a week in order to decrease pressure on bladder to allow for less urinary urgency.  Baseline:  Goal status: INITIAL  PLAN:  PT FREQUENCY: 1-2x/week  PT DURATION: 6 months  PLANNED INTERVENTIONS: 97110-Therapeutic exercises, 97530- Therapeutic activity, 97112- Neuromuscular re-education, 97535- Self Care, 02859- Manual therapy, Dry Needling, and Biofeedback  PLAN FOR NEXT SESSION: Inverted lying position; self-abdominal massage; official voiding schedule - possibly give bowel/bladder journal; start mobility/core training; review pelvic floor muscle contractions for correct performance   Josette Mares, PT, DPT07/08/258:41 AM

## 2023-09-05 NOTE — Telephone Encounter (Signed)
 Inbound call from patient, states she is not sure Amitiza  is working. She states she is still have constipation and medication does not produce bowel movements. Patient would like to know if she can stop taking medication.

## 2023-09-05 NOTE — Telephone Encounter (Signed)
-----   Message from Vina Dasen sent at 09/05/2023  4:42 PM EDT ----- Hi,  I received the below staff message from a pelvic floor physical therapist regarding patient's bowel regimen.  Can someone please touch base with patient and see how she is doing. Thanks   Good morning,  I am seeing this patient in pelvic floor physical therapy. She says that she would really like to stop taking the Amitiza  because she feels like it isn't working. She is still only having 1 bowel movement a week with taking this medication on a regular schedule. She told me that she had much better luck with Miralax and her smooth move tea. We've talked a lot in the last two sessions about other things to help improve frequency and ease of bowel movements that I believe will start to be helpful as well. However, I was wondering if you might support her stopping the Amitiza  and we could go back to trying Miralax, but on a very regular schedule? And then due to her likely having significant stool burden and decreased sensation rectally right now, I was hoping that she could perform enemas as needed (she has done in the past) to get her having bowel movements daily. We normally ask for written permission in starting these things and the ability to titrate them according to bowel movements, any incontinence, and where they are on the Bristol Stool Scale if you are comfortable. If you would rather see her back in the office, I can also have her call and schedule an appointment. I appreciate any guidance and thoughts you may have to help us . Thank you!  Monica Mares

## 2023-09-06 ENCOUNTER — Ambulatory Visit
Admission: RE | Admit: 2023-09-06 | Discharge: 2023-09-06 | Disposition: A | Discharge status: Home / Self Care | Source: Ambulatory Visit | Attending: Nurse Practitioner | Admitting: Nurse Practitioner

## 2023-09-06 DIAGNOSIS — N6310 Unspecified lump in the right breast, unspecified quadrant: Secondary | ICD-10-CM

## 2023-09-06 DIAGNOSIS — N6313 Unspecified lump in the right breast, lower outer quadrant: Secondary | ICD-10-CM | POA: Diagnosis not present

## 2023-09-12 ENCOUNTER — Ambulatory Visit: Payer: Self-pay

## 2023-09-12 DIAGNOSIS — M6281 Muscle weakness (generalized): Secondary | ICD-10-CM | POA: Diagnosis not present

## 2023-09-12 DIAGNOSIS — R279 Unspecified lack of coordination: Secondary | ICD-10-CM

## 2023-09-12 DIAGNOSIS — M62838 Other muscle spasm: Secondary | ICD-10-CM

## 2023-09-12 DIAGNOSIS — R293 Abnormal posture: Secondary | ICD-10-CM

## 2023-09-12 NOTE — Therapy (Signed)
 OUTPATIENT PHYSICAL THERAPY FEMALE PELVIC TREATMENT   Patient Name: Meghan Fernandez MRN: [id number] DOB:02-Jan-1981, 43 y.o., female Today's Date: 09/12/2023  END OF SESSION:  PT End of Session - 09/12/23 0851     Visit Number 5    Date for PT Re-Evaluation 01/29/24    Authorization Type [employer]    Authorization Time Period 08/14/2023-10/12/2023    Authorization - Visit Number 4    Authorization - Number of Visits 5    PT Start Time 0847    PT Stop Time 0928    PT Time Calculation (min) 41 min    Activity Tolerance Patient tolerated treatment well    Behavior During Therapy [facility] for tasks assessed/performed           Past Medical History:  Diagnosis Date   Allergy    Anemia    Asthma    childhood no inhaler now   External hemorrhoids with complication    Fibroids    History of asthma    childhood   History of chicken pox    Past Surgical History:  Procedure Laterality Date   COLONOSCOPY     Patient Active Problem List   Diagnosis Date Noted   Pure hypercholesterolemia 03/14/2023   Abnormal uterine bleeding 03/14/2023   Morbid obesity (HCC) 08/31/2022   Vitamin D  deficiency 03/10/2022   Environmental allergies 02/02/2022   Obesity (BMI 30-39.9) 02/02/2022   Chronic constipation 02/02/2022   Anemia 02/02/2022   Insomnia 02/02/2022   Menorrhagia with regular cycle 02/02/2022   Urinary frequency 02/02/2022    PCP: Nedra Tinnie DELENA, NP  REFERRING PROVIDER: Erik Kieth BROCKS, MD   REFERRING DIAG: N32.81 (ICD-10-CM) - OAB (overactive bladder)  THERAPY DIAG:  Abnormal posture  Muscle weakness (generalized)  Other muscle spasm  Unspecified lack of coordination  Rationale for Evaluation and Treatment: Rehabilitation  ONSET DATE: 09/11/2013  SUBJECTIVE:                                                                                                                                                                                           SUBJECTIVE  STATEMENT: Pt stopped medication that was causing her to have constant cycle. She has not had bleeding for 4 days. She has been working on eating a little better, but is still taking protein powder because she has not run out yet. She has not been exercising.   Pt states that her bladder is still bothering her more than her bowel movements because her bladder will embarrass her.   PAIN: 09/12/23 Are you having pain? Yes NPRS scale: 4/10 Pain location: low back pain (2021 started)  Pain type: aching and stiff/spasm Pain description: intermittent   Aggravating factors: bending Relieving factors: standing and moving   PRECAUTIONS: None  RED FLAGS: None   WEIGHT BEARING RESTRICTIONS: No  FALLS:  Has patient fallen in last 6 months? No  OCCUPATION: office job  ACTIVITY LEVEL : no current exercise   PLOF: Independent  PATIENT GOALS: to improve bladder control   PERTINENT HISTORY:  External hemorrhoids, fibroids, history of asthma, chronic constipation,   BOWEL MOVEMENT: Pain with bowel movement: Yes Type of bowel movement:Type (Bristol Stool Scale) 4 since she is on medication, Frequency 1x/week, and Strain yes - will manually have to get bowel movement out Fully empty rectum: No Leakage: No Pads: Yes: see below Fiber supplement/laxative Yes - Amitiza   URINATION: Pain with urination: No Fully empty bladder: No Stream: variety - sometimes will just drip out, sometimes will be strong Urgency: Yes  Frequency: depends on water intake - sometimes she has to go every 5 minutes; at night she goes 3-4x/night Fluid Intake: working on drinking more water - sometimes one bottle will send her to the bathroom many times  Leakage: Urge to void, Walking to the bathroom, Coughing, Sneezing, and Laughing (if she already has to pee) Pads: Yes: every day - 3 pads - smooth move occasionally  INTERCOURSE:  Not currently sexually active  PREGNANCY: Vaginal deliveries 2 Tearing Yes:  small tears  Episiotomy No C-section deliveries 0 Currently pregnant No  PROLAPSE: None   OBJECTIVE:  Note: Objective measures were completed at Evaluation unless otherwise noted.  09/12/23: Single leg stance:  Rt: pelvic drop Lt: pelvic drop  Intra-rectal pressure: good force generation, but external anal sphincter contraction at the same time indicating dyssynergia External anal sphincter strength 3/5  08/14/23: PATIENT SURVEYS:   PFIQ-7: 90  COGNITION: Overall cognitive status: Within functional limits for tasks assessed     SENSATION: Light touch: Appears intact   FUNCTIONAL TESTS:  Squat: Lt weight shift Single leg stance:  Rt: pelvic drop  Lt: pelvic drop Curl-up test: abdominal disotrtion   GAIT: Assistive device utilized: None Comments: WNL  POSTURE: rounded shoulders and forward head   LUMBARAROM/PROM: WNL   PALPATION:   General: tightness throughout low back  Pelvic Alignment: Lt posterior rotation  Abdominal: good rib cage mobility; significant tightness/distention of abdomen - patient reports that firmness palpated is what shefeels like is her fibroids                External Perineal Exam: WNL                             Internal Pelvic Floor: no tenderness  Patient confirms identification and approves PT to assess internal pelvic floor and treatment Yes  PELVIC MMT:   MMT eval  Vaginal 4/5, 12 seconds, 6 repeat contractions; coordination poor with tendency to bear down instead of drawing in  Diastasis Recti 2 finger width separation  (Blank rows = not tested)        TONE: WNL  PROLAPSE: Grade 1 posterior wall/grade 2 anterior wall laxity (morning tested in supine)  TODAY'S TREATMENT:  DATE:  09/12/23 Manual: Pt provides verbal consent for internal rectal pelvic floor exam. Internal rectal  exam Neuromuscular re-education: Internal rectal push training with exhale Pelvic floor muscle strengthening program review in standing Quick flicks 10x Long holds 5 x 10 seconds Therapeutic activities: Review bladder retraining - pt has not been working on this Education on how to utilize urge drill - practice pelvic floor muscle training program in standing when she does not have urgency Concepts of pressure management and healthy pushing with bowel movements    09/05/23 Manual: Bowel mobilization  Ileocecal valve mobilization ILU massage Exercises: Lower trunk rotation 2 x 10 Modified thomas stretch 60 sec bil Bridge with hip adduction 2 x 10 Therapeutic activities: Revisited fiber rich foods and incorporating more in her meals    08/28/23 Manual: Bowel mobilization  Ileocecal valve mobilization ILU massage Therapeutic activities: Review of splinting Training on self-bowel massage 150 minutes moderate intensity exercise a week    PATIENT EDUCATION:  Education details: See above Person educated: Patient Education method: Explanation, Demonstration, Tactile cues, Verbal cues, and Handouts Education comprehension: verbalized understanding  HOME EXERCISE PROGRAM: [id number]  ASSESSMENT:  CLINICAL IMPRESSION: Pt is still having trouble with bowel and bladder. She feels like bladder symptoms/urgency is about 10% better because she still has urgency. However, she is only using 2 pads a day (down from 3) and able to go 3 hours between voids. She had 6 bowel movements in the last week, but they are not complete. She is not straining or pushing at all right now. Due to this, we decided to perform intra-rectal exam to see if she could increase appropriate intra-rectal pressure while eccentrically lengthening external anal sphincter. She was able to increase intra-rectal pressure well, but held breath and tightened external anal sphincter. She responded very well to multimodal cues  with internal rectal feedback to help improve. She was encouraged to use these techniques when trying to have bowel movement. We also reviewed voiding schedule for urination and urge drill. She was instructed to work on pelvic floor muscle strengthening in standing positions. We will work on progressing to more strengthening activities to help further her control. She will continue to benefit from skilled PT intervention in order to decrease urinary urgency/incontinence, address all impairments, improve chronic constipation, and begin/progress functional strengthening program.   OBJECTIVE IMPAIRMENTS: decreased activity tolerance, decreased coordination, decreased endurance, decreased mobility, decreased ROM, decreased strength, increased fascial restrictions, increased muscle spasms, impaired flexibility, impaired tone, improper body mechanics, postural dysfunction, and pain.   ACTIVITY LIMITATIONS: bending, sleeping, and continence  PARTICIPATION LIMITATIONS: cleaning, laundry, community activity, and occupation  PERSONAL FACTORS: 1 comorbidity: medical history are also affecting patient's functional outcome.   REHAB POTENTIAL: Good  CLINICAL DECISION MAKING: Stable/uncomplicated  EVALUATION COMPLEXITY: Low   GOALS: Goals reviewed with patient? Yes  SHORT TERM GOALS: Updated 09/12/23   Pt will be independent with HEP to increase activity tolerance.   Baseline: Goal status: MET 09/12/23  2.  Pt will report low back pain no greater than 2/10 in order to perform household cleaning and laundry without pain.  Baseline: not constant Goal status: IN PROGRESS 09/12/23  3.  Pt will be independent with the knack, urge suppression technique, and double voiding in order to improve bladder habits and decrease urinary incontinence.   Baseline: pt has not been utilizing - she states that she forgot due to focusing on bowel movements - she felt like when she first started she could not do in  standing Goal status: IN PROGRESS 09/12/23  4.  Pt will report 25% improvement in urinary urgency in order to have more time to focus on work, household chores, and hobbies.  Baseline: pt feels like bladder is 10% better Goal status: IN PROGRESS 09/12/23  5.  Pt will be independent with use of squatty potty, relaxed toileting mechanics, and improved bowel movement techniques in order to increase ease of bowel movements and complete evacuation.   Baseline: using regularly Goal status: MET 09/12/23  6.  Pt will be independent with use of splinting in order to help improve ease of bowel movements.  Baseline: Pt is doing this once bowel movement is there, not sure she notices that it is helpful Goal status: MET 09/12/23  LONG TERM GOALS: Updated 09/12/23  Pt will be independent with advanced HEP.   Baseline:  Goal status: IN PROGRESS 09/12/23  2.  Pt will be able to go 2-3 hours in between voids without urgency or incontinence in order to improve QOL and perform all functional activities with less difficulty.   Baseline: yesterday so only went every 3 hours - but still has urgency at that time Goal status: IN PROGRESS 09/12/23  3.  Pt will report use of no more than 1 pad a day for urinary incontinence in order to demonstrate improvements with incontinence and decrease risk of infection.  Baseline: only using 2 a day Goal status: IN PROGRESS 09/12/23  4.  Pt will report 75% improvement in pain in order to play with kids and do household chores without difficulty.  Baseline: 10% better Goal status: IN PROGRESS 09/12/23  5.  Pt will be able to perform single leg stance without pelvic drop in order to improve core stability to help with urinary incontinence and decrease low back pain.  Baseline: continues to have pelvic drop bil Goal status: IN PROGRESS 09/12/23  6.  Pt will have at least 4 bowel movements a week in order to decrease pressure on bladder to allow for less urinary urgency.   Baseline: she had 6, but feels like they are incomplete Goal status: MET 09/12/23  7.  Pt will have at least 7 complete bowel movements a week in order to decrease pressure on bladder to allow for less urinary urgency.  Baseline: s Goal status: INITIAL  PLAN:  PT FREQUENCY: 1-2x/week  PT DURATION: 6 months  PLANNED INTERVENTIONS: 97110-Therapeutic exercises, 97530- Therapeutic activity, 97112- Neuromuscular re-education, 97535- Self Care, [id number]- Manual therapy, Dry Needling, and Biofeedback  PLAN FOR NEXT SESSION: Inverted lying position; self-abdominal massage; official voiding schedule - possibly give bowel/bladder journal; start mobility/core training; review pelvic floor muscle contractions for correct performance   Josette Mares, PT, DPT07/15/2510:05 AM

## 2023-09-12 NOTE — Telephone Encounter (Signed)
 Patient requesting call back regarding message below. Would like further guidance on how to stop medication. Please advise, thank you.

## 2023-09-12 NOTE — Telephone Encounter (Signed)
 Spoke to patient who states that she has been working with physical therapy to help with bowel/bladder control over the last several months. Experiences constipation, sometimes having only 1 bowel movement per week. She was previously given Amitiza  8 mcg twice daily at visit with Vina. However, she states that this has not helped her to have bowel movements at all. It did seem to soften the stool though. Patient states that previously, smooth move tea helped more than the Amitiza . States physical therapy wants Dr Stacia to advise if she can d/c amitiza  and follow recommendations as outlined by them for improvement in constipation.   08/23/23 PT note: To help with bowel movements, we went over how food, which she is taking in very little of to help with weight management, is needed to stimulate normal digestion. If she is only drinking protein shakes she is not getting normal triggers for motility. Believe that she has large stool burden and we need to talk with MD about possibly using enemas in order to get her having regular bowel movements and then following up with laxatives in order to maintain regular bowel movements to let colon heal and normal sensation/stretch reflexes to develop again. She will continue to benefit from skilled PT intervention in order to decrease urinary urgency/incontinence, address all impairments, improve chronic constipation, and begin/progress functional strengthening program.

## 2023-09-19 ENCOUNTER — Ambulatory Visit: Admitting: Physical Therapy

## 2023-09-19 DIAGNOSIS — M62838 Other muscle spasm: Secondary | ICD-10-CM | POA: Diagnosis not present

## 2023-09-19 DIAGNOSIS — M6281 Muscle weakness (generalized): Secondary | ICD-10-CM | POA: Diagnosis not present

## 2023-09-19 DIAGNOSIS — R279 Unspecified lack of coordination: Secondary | ICD-10-CM

## 2023-09-19 DIAGNOSIS — R293 Abnormal posture: Secondary | ICD-10-CM | POA: Diagnosis not present

## 2023-09-19 NOTE — Therapy (Signed)
 OUTPATIENT PHYSICAL THERAPY FEMALE PELVIC TREATMENT   Patient Name: Meghan Fernandez MRN: [id number] DOB:1980-08-05, 43 y.o., female Today's Date: 09/19/2023  END OF SESSION:  PT End of Session - 09/19/23 0808     Visit Number 6    Date for PT Re-Evaluation 01/29/24    Authorization Type [employer]    Authorization Time Period 08/14/2023-8/14/2025CARELON APPROVED 4 VISITS 09/12/2023 - 10/11/2023 #[id number]    Authorization - Visit Number 1    Authorization - Number of Visits 4    PT Start Time 0804    PT Stop Time 0844    PT Time Calculation (min) 40 min    Activity Tolerance Patient tolerated treatment well    Behavior During Therapy [facility] for tasks assessed/performed           Past Medical History:  Diagnosis Date   Allergy    Anemia    Asthma    childhood no inhaler now   External hemorrhoids with complication    Fibroids    History of asthma    childhood   History of chicken pox    Past Surgical History:  Procedure Laterality Date   COLONOSCOPY     Patient Active Problem List   Diagnosis Date Noted   Pure hypercholesterolemia 03/14/2023   Abnormal uterine bleeding 03/14/2023   Morbid obesity (HCC) 08/31/2022   Vitamin D  deficiency 03/10/2022   Environmental allergies 02/02/2022   Obesity (BMI 30-39.9) 02/02/2022   Chronic constipation 02/02/2022   Anemia 02/02/2022   Insomnia 02/02/2022   Menorrhagia with regular cycle 02/02/2022   Urinary frequency 02/02/2022    PCP: Nedra Tinnie DELENA, NP  REFERRING PROVIDER: Erik Kieth BROCKS, MD   REFERRING DIAG: N32.81 (ICD-10-CM) - OAB (overactive bladder)  THERAPY DIAG:  Abnormal posture  Muscle weakness (generalized)  Other muscle spasm  Unspecified lack of coordination  Rationale for Evaluation and Treatment: Rehabilitation  ONSET DATE: 09/11/2013  SUBJECTIVE:                                                                                                                                                                                            SUBJECTIVE STATEMENT: Bladder symptoms still present, standing is still very difficult and doesn't always work. Can feel fibroids with eating, going to the bathroom. Doing the bowel retraining has been helping a lot and having 4 larger bowel movements a week but not feeling done. And needing to take miralax.     PAIN: 09/19/23 Are you having pain? Yes NPRS scale: 0/10 Pain location: low back pain (2021 started)  Pain type: aching and stiff/spasm Pain description: intermittent  Aggravating factors: bending Relieving factors: standing and moving   PRECAUTIONS: None  RED FLAGS: None   WEIGHT BEARING RESTRICTIONS: No  FALLS:  Has patient fallen in last 6 months? No  OCCUPATION: office job  ACTIVITY LEVEL : no current exercise   PLOF: Independent  PATIENT GOALS: to improve bladder control   PERTINENT HISTORY:  External hemorrhoids, fibroids, history of asthma, chronic constipation,   BOWEL MOVEMENT: Pain with bowel movement: Yes Type of bowel movement:Type (Bristol Stool Scale) 4 since she is on medication, Frequency 1x/week, and Strain yes - will manually have to get bowel movement out Fully empty rectum: No Leakage: No Pads: Yes: see below Fiber supplement/laxative Yes - Amitiza   URINATION: Pain with urination: No Fully empty bladder: No Stream: variety - sometimes will just drip out, sometimes will be strong Urgency: Yes  Frequency: depends on water intake - sometimes she has to go every 5 minutes; at night she goes 3-4x/night Fluid Intake: working on drinking more water - sometimes one bottle will send her to the bathroom many times  Leakage: Urge to void, Walking to the bathroom, Coughing, Sneezing, and Laughing (if she already has to pee) Pads: Yes: every day - 3 pads - smooth move occasionally  INTERCOURSE:  Not currently sexually active  PREGNANCY: Vaginal deliveries 2 Tearing Yes: small tears  Episiotomy  No C-section deliveries 0 Currently pregnant No  PROLAPSE: None   OBJECTIVE:  Note: Objective measures were completed at Evaluation unless otherwise noted.  09/12/23: Single leg stance:  Rt: pelvic drop Lt: pelvic drop  Intra-rectal pressure: good force generation, but external anal sphincter contraction at the same time indicating dyssynergia External anal sphincter strength 3/5  08/14/23: PATIENT SURVEYS:   PFIQ-7: 90  COGNITION: Overall cognitive status: Within functional limits for tasks assessed     SENSATION: Light touch: Appears intact   FUNCTIONAL TESTS:  Squat: Lt weight shift Single leg stance:  Rt: pelvic drop  Lt: pelvic drop Curl-up test: abdominal disotrtion   GAIT: Assistive device utilized: None Comments: WNL  POSTURE: rounded shoulders and forward head   LUMBARAROM/PROM: WNL   PALPATION:   General: tightness throughout low back  Pelvic Alignment: Lt posterior rotation  Abdominal: good rib cage mobility; significant tightness/distention of abdomen - patient reports that firmness palpated is what shefeels like is her fibroids                External Perineal Exam: WNL                             Internal Pelvic Floor: no tenderness  Patient confirms identification and approves PT to assess internal pelvic floor and treatment Yes  PELVIC MMT:   MMT eval 09/12/23  Vaginal 4/5, 12 seconds, 6 repeat contractions; coordination poor with tendency to bear down instead of drawing in   Diastasis Recti 2 finger width separation   Rectal assessment   Completed findings in treatment note  (Blank rows = not tested)        TONE: WNL  PROLAPSE: Grade 1 posterior wall/grade 2 anterior wall laxity (morning tested in supine)  TODAY'S TREATMENT:  DATE:  09/19/23: Therapeutic activity: Pt had several questions about bowel  and bladder habits and mechanics - all answered and education below Pelvic mobility with toileting as needed for improved posterior pelvic floor relaxation  Balloon breathing for assisting in evacuation  Splinting for bowel movements for evacuation Moistened wipes or peri bottle for skin cleaning after bowel movements as pt reports pain at external anal sphincter after emptying sometimes and reports she does wipe several times currently Variations for urge drill with adding counting backwards from 10 or heel raises to improve effectiveness  Updated HEP for improved pelvic floor mobility and relaxation techniques and strength for pt to better complete at desk during work day as she reports this can be a barrier for her with exercises   09/12/23 Manual: Pt provides verbal consent for internal rectal pelvic floor exam. Internal rectal exam Neuromuscular re-education: Internal rectal push training with exhale Pelvic floor muscle strengthening program review in standing Quick flicks 10x Long holds 5 x 10 seconds Therapeutic activities: Review bladder retraining - pt has not been working on this Education on how to utilize urge drill - practice pelvic floor muscle training program in standing when she does not have urgency Concepts of pressure management and healthy pushing with bowel movements    09/05/23 Manual: Bowel mobilization  Ileocecal valve mobilization ILU massage Exercises: Lower trunk rotation 2 x 10 Modified thomas stretch 60 sec bil Bridge with hip adduction 2 x 10 Therapeutic activities: Revisited fiber rich foods and incorporating more in her meals    08/28/23 Manual: Bowel mobilization  Ileocecal valve mobilization ILU massage Therapeutic activities: Review of splinting Training on self-bowel massage 150 minutes moderate intensity exercise a week    PATIENT EDUCATION:  Education details: See above Person educated: Patient Education method: Explanation,  Demonstration, Tactile cues, Verbal cues, and Handouts Education comprehension: verbalized understanding  HOME EXERCISE PROGRAM: [email or web address]  ASSESSMENT:  CLINICAL IMPRESSION: Pt is still having trouble with bowel and bladder. Session focused on reviewing all of pt questions about current recommendations and updating any recommendations to improve bowel and bladder habits. Pt tolerated well and denied additional questions at end of session, updated HEP as well. She will continue to benefit from skilled PT intervention in order to decrease urinary urgency/incontinence, address all impairments, improve chronic constipation, and begin/progress functional strengthening program.   OBJECTIVE IMPAIRMENTS: decreased activity tolerance, decreased coordination, decreased endurance, decreased mobility, decreased ROM, decreased strength, increased fascial restrictions, increased muscle spasms, impaired flexibility, impaired tone, improper body mechanics, postural dysfunction, and pain.   ACTIVITY LIMITATIONS: bending, sleeping, and continence  PARTICIPATION LIMITATIONS: cleaning, laundry, community activity, and occupation  PERSONAL FACTORS: 1 comorbidity: medical history are also affecting patient's functional outcome.   REHAB POTENTIAL: Good  CLINICAL DECISION MAKING: Stable/uncomplicated  EVALUATION COMPLEXITY: Low   GOALS: Goals reviewed with patient? Yes  SHORT TERM GOALS: Updated 09/12/23   Pt will be independent with HEP to increase activity tolerance.   Baseline: Goal status: MET 09/12/23  2.  Pt will report low back pain no greater than 2/10 in order to perform household cleaning and laundry without pain.  Baseline: not constant Goal status: IN PROGRESS 09/12/23  3.  Pt will be independent with the knack, urge suppression technique, and double voiding in order to improve bladder habits and decrease urinary incontinence.   Baseline: pt has not been utilizing - she states that she  forgot due to focusing on bowel movements - she felt like when she first started she could  not do in standing Goal status: IN PROGRESS 09/12/23  4.  Pt will report 25% improvement in urinary urgency in order to have more time to focus on work, household chores, and hobbies.  Baseline: pt feels like bladder is 10% better Goal status: IN PROGRESS 09/12/23  5.  Pt will be independent with use of squatty potty, relaxed toileting mechanics, and improved bowel movement techniques in order to increase ease of bowel movements and complete evacuation.   Baseline: using regularly Goal status: MET 09/12/23  6.  Pt will be independent with use of splinting in order to help improve ease of bowel movements.  Baseline: Pt is doing this once bowel movement is there, not sure she notices that it is helpful Goal status: MET 09/12/23  LONG TERM GOALS: Updated 09/12/23  Pt will be independent with advanced HEP.   Baseline:  Goal status: IN PROGRESS 09/12/23  2.  Pt will be able to go 2-3 hours in between voids without urgency or incontinence in order to improve QOL and perform all functional activities with less difficulty.   Baseline: yesterday so only went every 3 hours - but still has urgency at that time Goal status: IN PROGRESS 09/12/23  3.  Pt will report use of no more than 1 pad a day for urinary incontinence in order to demonstrate improvements with incontinence and decrease risk of infection.  Baseline: only using 2 a day Goal status: IN PROGRESS 09/12/23  4.  Pt will report 75% improvement in pain in order to play with kids and do household chores without difficulty.  Baseline: 10% better Goal status: IN PROGRESS 09/12/23  5.  Pt will be able to perform single leg stance without pelvic drop in order to improve core stability to help with urinary incontinence and decrease low back pain.  Baseline: continues to have pelvic drop bil Goal status: IN PROGRESS 09/12/23  6.  Pt will have at least 4  bowel movements a week in order to decrease pressure on bladder to allow for less urinary urgency.  Baseline: she had 6, but feels like they are incomplete Goal status: MET 09/12/23  7.  Pt will have at least 7 complete bowel movements a week in order to decrease pressure on bladder to allow for less urinary urgency.  Baseline: s Goal status: INITIAL  PLAN:  PT FREQUENCY: 1-2x/week  PT DURATION: 6 months  PLANNED INTERVENTIONS: 97110-Therapeutic exercises, 97530- Therapeutic activity, 97112- Neuromuscular re-education, 97535- Self Care, [id number]- Manual therapy, Dry Needling, and Biofeedback  PLAN FOR NEXT SESSION: Inverted lying position; self-abdominal massage; official voiding schedule - possibly give bowel/bladder journal; start mobility/core training; review pelvic floor muscle contractions for correct performance   Darryle Navy, PT, DPT 07/22/259:34 AM  [facility] [address] [place], [place] [address] Phone # [phone] Fax [phone]

## 2023-09-25 ENCOUNTER — Ambulatory Visit

## 2023-09-25 DIAGNOSIS — R279 Unspecified lack of coordination: Secondary | ICD-10-CM | POA: Diagnosis not present

## 2023-09-25 DIAGNOSIS — M6281 Muscle weakness (generalized): Secondary | ICD-10-CM | POA: Diagnosis not present

## 2023-09-25 DIAGNOSIS — R293 Abnormal posture: Secondary | ICD-10-CM

## 2023-09-25 DIAGNOSIS — M62838 Other muscle spasm: Secondary | ICD-10-CM | POA: Diagnosis not present

## 2023-09-25 NOTE — Therapy (Signed)
 OUTPATIENT PHYSICAL THERAPY FEMALE PELVIC TREATMENT   Patient Name: Meghan Fernandez MRN: [id number] DOB:Jul 17, 1980, 43 y.o., female Today's Date: 09/25/2023  END OF SESSION:  PT End of Session - 09/25/23 1013     Visit Number 7    Date for PT Re-Evaluation 01/29/24    Authorization Type [employer]    Authorization Time Period 08/14/2023-8/14/2025CARELON APPROVED 4 VISITS 09/12/2023 - 10/11/2023 #[id number]    Authorization - Visit Number 2    Authorization - Number of Visits 4    PT Start Time 1015    PT Stop Time 1058    PT Time Calculation (min) 43 min    Activity Tolerance Patient tolerated treatment well    Behavior During Therapy WFL for tasks assessed/performed           Past Medical History:  Diagnosis Date   Allergy    Anemia    Asthma    childhood no inhaler now   External hemorrhoids with complication    Fibroids    History of asthma    childhood   History of chicken pox    Past Surgical History:  Procedure Laterality Date   COLONOSCOPY     Patient Active Problem List   Diagnosis Date Noted   Pure hypercholesterolemia 03/14/2023   Abnormal uterine bleeding 03/14/2023   Morbid obesity (HCC) 08/31/2022   Vitamin D  deficiency 03/10/2022   Environmental allergies 02/02/2022   Obesity (BMI 30-39.9) 02/02/2022   Chronic constipation 02/02/2022   Anemia 02/02/2022   Insomnia 02/02/2022   Menorrhagia with regular cycle 02/02/2022   Urinary frequency 02/02/2022    PCP: Nedra Tinnie DELENA, NP  REFERRING PROVIDER: Erik Kieth BROCKS, MD   REFERRING DIAG: N32.81 (ICD-10-CM) - OAB (overactive bladder)  THERAPY DIAG:  Abnormal posture  Muscle weakness (generalized)  Other muscle spasm  Unspecified lack of coordination  Rationale for Evaluation and Treatment: Rehabilitation  ONSET DATE: 09/11/2013  SUBJECTIVE:                                                                                                                                                                                            SUBJECTIVE STATEMENT: Pt has stopped taking Amitiza , but has not started taking Miralax; she states that MD has said it is ok to start using Miralax. She did take 3 Dulcolax last week and states that it was very effective. She is having a small bowel movement every other day that does not feel like a good one. She states that bladder is ok; she feels like sipping on water is helping. When she was walking Friday and Saturday she had a  lot of difficulty with leaking.     PAIN: 09/19/23 Are you having pain? Yes NPRS scale: 0/10 Pain location: low back pain (2021 started)  Pain type: aching and stiff/spasm Pain description: intermittent   Aggravating factors: bending Relieving factors: standing and moving   PRECAUTIONS: None  RED FLAGS: None   WEIGHT BEARING RESTRICTIONS: No  FALLS:  Has patient fallen in last 6 months? No  OCCUPATION: office job  ACTIVITY LEVEL : no current exercise   PLOF: Independent  PATIENT GOALS: to improve bladder control   PERTINENT HISTORY:  External hemorrhoids, fibroids, history of asthma, chronic constipation,   BOWEL MOVEMENT: Pain with bowel movement: Yes Type of bowel movement:Type (Bristol Stool Scale) 4 since she is on medication, Frequency 1x/week, and Strain yes - will manually have to get bowel movement out Fully empty rectum: No Leakage: No Pads: Yes: see below Fiber supplement/laxative Yes - Amitiza   URINATION: Pain with urination: No Fully empty bladder: No Stream: variety - sometimes will just drip out, sometimes will be strong Urgency: Yes  Frequency: depends on water intake - sometimes she has to go every 5 minutes; at night she goes 3-4x/night Fluid Intake: working on drinking more water - sometimes one bottle will send her to the bathroom many times  Leakage: Urge to void, Walking to the bathroom, Coughing, Sneezing, and Laughing (if she already has to pee) Pads: Yes: every day  - 3 pads - smooth move occasionally  INTERCOURSE:  Not currently sexually active  PREGNANCY: Vaginal deliveries 2 Tearing Yes: small tears  Episiotomy No C-section deliveries 0 Currently pregnant No  PROLAPSE: None   OBJECTIVE:  Note: Objective measures were completed at Evaluation unless otherwise noted.  09/12/23: Single leg stance:  Rt: pelvic drop Lt: pelvic drop  Intra-rectal pressure: good force generation, but external anal sphincter contraction at the same time indicating dyssynergia External anal sphincter strength 3/5  08/14/23: PATIENT SURVEYS:   PFIQ-7: 90  COGNITION: Overall cognitive status: Within functional limits for tasks assessed     SENSATION: Light touch: Appears intact   FUNCTIONAL TESTS:  Squat: Lt weight shift Single leg stance:  Rt: pelvic drop  Lt: pelvic drop Curl-up test: abdominal disotrtion   GAIT: Assistive device utilized: None Comments: WNL  POSTURE: rounded shoulders and forward head   LUMBARAROM/PROM: WNL   PALPATION:   General: tightness throughout low back  Pelvic Alignment: Lt posterior rotation  Abdominal: good rib cage mobility; significant tightness/distention of abdomen - patient reports that firmness palpated is what shefeels like is her fibroids                External Perineal Exam: WNL                             Internal Pelvic Floor: no tenderness  Patient confirms identification and approves PT to assess internal pelvic floor and treatment Yes  PELVIC MMT:   MMT eval  Vaginal 4/5, 12 seconds, 6 repeat contractions; coordination poor with tendency to bear down instead of drawing in  Diastasis Recti 2 finger width separation  Rectal assessment    (Blank rows = not tested)        TONE: WNL  PROLAPSE: Grade 1 posterior wall/grade 2 anterior wall laxity (morning tested in supine)  TODAY'S TREATMENT:  DATE:  09/25/23 Neuromuscular re-education: Transversus abdominus training with multimodal cues for improved motor control and breath coordination Transversus abdominus isometrics 10x Supine resisted hip flexion 10x bil Bridge with hip adduction, transversus abdominus, and pelvic floor muscle 2 x 10 Side lying clam shells with UE ball press 2 x 10 bil  Seated hip adduction ball press with transversus abdominus and pelvic floor muscle 2 x 10 Seated hip abduction red band with transversus abdominus and pelvic floor muscle 2 x 10 Seated resisted march red band with transversus abdominus and pelvic floor muscle 2 x 10 Therapeutic activities: Continue to review splinting and education on external anal sphincter relaxation at the same time Squat/heel raises break every 10-20 minutes when walking   09/19/23: Therapeutic activity: Pt had several questions about bowel and bladder habits and mechanics - all answered and education below Pelvic mobility with toileting as needed for improved posterior pelvic floor relaxation  Balloon breathing for assisting in evacuation  Splinting for bowel movements for evacuation Moistened wipes or peri bottle for skin cleaning after bowel movements as pt reports pain at external anal sphincter after emptying sometimes and reports she does wipe several times currently Variations for urge drill with adding counting backwards from 10 or heel raises to improve effectiveness  Updated HEP for improved pelvic floor mobility and relaxation techniques and strength for pt to better complete at desk during work day as she reports this can be a barrier for her with exercises   09/12/23 Manual: Pt provides verbal consent for internal rectal pelvic floor exam. Internal rectal exam Neuromuscular re-education: Internal rectal push training with exhale Pelvic floor muscle strengthening program review in standing Quick flicks 10x Long holds 5 x 10  seconds Therapeutic activities: Review bladder retraining - pt has not been working on this Education on how to utilize urge drill - practice pelvic floor muscle training program in standing when she does not have urgency Concepts of pressure management and healthy pushing with bowel movements      PATIENT EDUCATION:  Education details: See above Person educated: Patient Education method: Explanation, Demonstration, Tactile cues, Verbal cues, and Handouts Education comprehension: verbalized understanding  HOME EXERCISE PROGRAM: [id number]  ASSESSMENT:  CLINICAL IMPRESSION: Pt still having difficult with bowel and bladder. She has stopped use of Amitiza  and used laxative 1x with better evacuation during that period, but has gone back to infrequent and incomplete bowel movements. She states that she is going to start taking Miralax per MD instructions and is going to follow instructions and take 2x/day, one capful.We reviewed splinting again and she was reminded that she was found to have paradoxical external anal sphincter contraction; in order to help prevent she was encouraged to use breathing and imagery to make sure external anal sphincter is relaxing and opening. She may be contracting too much during attempts at bowel movements therefore even splinting is not effective at allow productive bowel movement out. She will continue to benefit from skilled PT intervention in order to decrease urinary urgency/incontinence, address all impairments, improve chronic constipation, and begin/progress functional strengthening program.   OBJECTIVE IMPAIRMENTS: decreased activity tolerance, decreased coordination, decreased endurance, decreased mobility, decreased ROM, decreased strength, increased fascial restrictions, increased muscle spasms, impaired flexibility, impaired tone, improper body mechanics, postural dysfunction, and pain.   ACTIVITY LIMITATIONS: bending, sleeping, and  continence  PARTICIPATION LIMITATIONS: cleaning, laundry, community activity, and occupation  PERSONAL FACTORS: 1 comorbidity: medical history are also affecting patient's functional outcome.   REHAB POTENTIAL: Good  CLINICAL  DECISION MAKING: Stable/uncomplicated  EVALUATION COMPLEXITY: Low   GOALS: Goals reviewed with patient? Yes  SHORT TERM GOALS: Updated 09/12/23   Pt will be independent with HEP to increase activity tolerance.   Baseline: Goal status: MET 09/12/23  2.  Pt will report low back pain no greater than 2/10 in order to perform household cleaning and laundry without pain.  Baseline: not constant Goal status: IN PROGRESS 09/12/23  3.  Pt will be independent with the knack, urge suppression technique, and double voiding in order to improve bladder habits and decrease urinary incontinence.   Baseline: pt has not been utilizing - she states that she forgot due to focusing on bowel movements - she felt like when she first started she could not do in standing Goal status: IN PROGRESS 09/12/23  4.  Pt will report 25% improvement in urinary urgency in order to have more time to focus on work, household chores, and hobbies.  Baseline: pt feels like bladder is 10% better Goal status: IN PROGRESS 09/12/23  5.  Pt will be independent with use of squatty potty, relaxed toileting mechanics, and improved bowel movement techniques in order to increase ease of bowel movements and complete evacuation.   Baseline: using regularly Goal status: MET 09/12/23  6.  Pt will be independent with use of splinting in order to help improve ease of bowel movements.  Baseline: Pt is doing this once bowel movement is there, not sure she notices that it is helpful Goal status: MET 09/12/23  LONG TERM GOALS: Updated 09/12/23  Pt will be independent with advanced HEP.   Baseline:  Goal status: IN PROGRESS 09/12/23  2.  Pt will be able to go 2-3 hours in between voids without urgency or  incontinence in order to improve QOL and perform all functional activities with less difficulty.   Baseline: yesterday so only went every 3 hours - but still has urgency at that time Goal status: IN PROGRESS 09/12/23  3.  Pt will report use of no more than 1 pad a day for urinary incontinence in order to demonstrate improvements with incontinence and decrease risk of infection.  Baseline: only using 2 a day Goal status: IN PROGRESS 09/12/23  4.  Pt will report 75% improvement in pain in order to play with kids and do household chores without difficulty.  Baseline: 10% better Goal status: IN PROGRESS 09/12/23  5.  Pt will be able to perform single leg stance without pelvic drop in order to improve core stability to help with urinary incontinence and decrease low back pain.  Baseline: continues to have pelvic drop bil Goal status: IN PROGRESS 09/12/23  6.  Pt will have at least 4 bowel movements a week in order to decrease pressure on bladder to allow for less urinary urgency.  Baseline: she had 6, but feels like they are incomplete Goal status: MET 09/12/23  7.  Pt will have at least 7 complete bowel movements a week in order to decrease pressure on bladder to allow for less urinary urgency.  Baseline:  Goal status: INITIAL  PLAN:  PT FREQUENCY: 1-2x/week  PT DURATION: 6 months  PLANNED INTERVENTIONS: 97110-Therapeutic exercises, 97530- Therapeutic activity, 97112- Neuromuscular re-education, 97535- Self Care, [id number]- Manual therapy, Dry Needling, and Biofeedback  PLAN FOR NEXT SESSION: Inverted lying position; self-abdominal massage; official voiding schedule - possibly give bowel/bladder journal; start mobility/core training; review pelvic floor muscle contractions for correct performance   Josette Mares, PT, DPT07/28/2510:54 AM

## 2023-09-26 ENCOUNTER — Encounter

## 2023-10-03 ENCOUNTER — Ambulatory Visit: Attending: Obstetrics and Gynecology

## 2023-10-03 ENCOUNTER — Encounter: Payer: Self-pay | Admitting: Internal Medicine

## 2023-10-03 ENCOUNTER — Ambulatory Visit: Admitting: Internal Medicine

## 2023-10-03 VITALS — BP 110/80 | HR 50 | Temp 98.5°F | Ht 69.0 in | Wt 237.6 lb

## 2023-10-03 DIAGNOSIS — R279 Unspecified lack of coordination: Secondary | ICD-10-CM | POA: Diagnosis not present

## 2023-10-03 DIAGNOSIS — M79674 Pain in right toe(s): Secondary | ICD-10-CM | POA: Diagnosis not present

## 2023-10-03 DIAGNOSIS — R293 Abnormal posture: Secondary | ICD-10-CM | POA: Insufficient documentation

## 2023-10-03 DIAGNOSIS — M6281 Muscle weakness (generalized): Secondary | ICD-10-CM | POA: Diagnosis not present

## 2023-10-03 DIAGNOSIS — M21611 Bunion of right foot: Secondary | ICD-10-CM | POA: Diagnosis not present

## 2023-10-03 DIAGNOSIS — M62838 Other muscle spasm: Secondary | ICD-10-CM | POA: Insufficient documentation

## 2023-10-03 DIAGNOSIS — R200 Anesthesia of skin: Secondary | ICD-10-CM

## 2023-10-03 MED ORDER — MELOXICAM 7.5 MG PO TABS: 7.5000 mg | ORAL_TABLET | Freq: Two times a day (BID) | ORAL | 0 refills | Status: DC

## 2023-10-03 NOTE — Patient Instructions (Signed)
  VISIT SUMMARY: Today, you were seen for numbness and pain in your right big toe, which has been bothering you for over a week. The pain is mainly around the joint and gets worse when you walk. You also started experiencing numbness in the same toe about a week after the pain began. There is no swelling, redness, or discoloration.  YOUR PLAN: -RIGHT BIG TOE PAIN AND NUMBNESS: You have pain and numbness in your right big toe, likely due to joint inflammation or pressure irritation. You are prescribed meloxicam  to take twice daily for 7 days with food to help reduce the inflammation and pain. You should rest and avoid walking or overusing the toe, apply ice to the affected area, and soak your foot in Epsom salts. You are also referred to a foot specialist for further evaluation and possible x-rays if there is no improvement.  -RIGHT FOOT BUNION: A bunion is a bony bump that forms on the joint at the base of your big toe. It may be altering your walking mechanics and contributing to the pain and numbness in your toe. This will be considered if you are referred to a foot specialist.  INSTRUCTIONS: Please follow up with a foot specialist if there is no improvement in your symptoms after following the treatment plan. Continue taking your prescribed medications and follow the rest and care instructions provided.                      Contains text generated by [employer].                                 Contains text generated by [employer].

## 2023-10-03 NOTE — Therapy (Signed)
 OUTPATIENT PHYSICAL THERAPY FEMALE PELVIC TREATMENT   Patient Name: Meghan Fernandez MRN: [id number] DOB:20-Oct-1980, 43 y.o., female Today's Date: 10/03/2023  END OF SESSION:  PT End of Session - 10/03/23 0802     Visit Number 8    Date for PT Re-Evaluation 01/29/24    Authorization Type [employer]    Authorization Time Period 08/14/2023-8/14/2025CARELON APPROVED 4 VISITS 09/12/2023 - 10/11/2023 #[id number]    Authorization - Visit Number 3    Authorization - Number of Visits 4    PT Start Time 0800    PT Stop Time 0842    PT Time Calculation (min) 42 min    Activity Tolerance Patient tolerated treatment well    Behavior During Therapy [facility] for tasks assessed/performed           Past Medical History:  Diagnosis Date   Allergy    Anemia    Asthma    childhood no inhaler now   External hemorrhoids with complication    Fibroids    History of asthma    childhood   History of chicken pox    Past Surgical History:  Procedure Laterality Date   COLONOSCOPY     Patient Active Problem List   Diagnosis Date Noted   Pure hypercholesterolemia 03/14/2023   Abnormal uterine bleeding 03/14/2023   Morbid obesity (HCC) 08/31/2022   Vitamin D  deficiency 03/10/2022   Environmental allergies 02/02/2022   Obesity (BMI 30-39.9) 02/02/2022   Chronic constipation 02/02/2022   Anemia 02/02/2022   Insomnia 02/02/2022   Menorrhagia with regular cycle 02/02/2022   Urinary frequency 02/02/2022    PCP: Nedra Tinnie DELENA, NP  REFERRING PROVIDER: Erik Kieth BROCKS, MD   REFERRING DIAG: N32.81 (ICD-10-CM) - OAB (overactive bladder)  THERAPY DIAG:  Abnormal posture  Muscle weakness (generalized)  Other muscle spasm  Unspecified lack of coordination  Rationale for Evaluation and Treatment: Rehabilitation  ONSET DATE: 09/11/2013  SUBJECTIVE:                                                                                                                                                                                            SUBJECTIVE STATEMENT: Pt states that she has been having bowel movement every morning. However, she states that she did not stop the Amitiza  when she said she was going to and has continued taking with smooth move tea even though she was instructed by the MD not to take these together. She does plan to stop taking Amitiza  per MD instructions.     PAIN: 10/03/23 Are you having pain? Yes NPRS scale: 0/10 Pain location: low back pain (2021 started)  Pain type: aching and stiff/spasm Pain description: intermittent   Aggravating factors: bending Relieving factors: standing and moving   PRECAUTIONS: None  RED FLAGS: None   WEIGHT BEARING RESTRICTIONS: No  FALLS:  Has patient fallen in last 6 months? No  OCCUPATION: office job  ACTIVITY LEVEL : no current exercise   PLOF: Independent  PATIENT GOALS: to improve bladder control   PERTINENT HISTORY:  External hemorrhoids, fibroids, history of asthma, chronic constipation,   BOWEL MOVEMENT: Pain with bowel movement: Yes Type of bowel movement:Type (Bristol Stool Scale) 4 since she is on medication, Frequency 1x/week, and Strain yes - will manually have to get bowel movement out Fully empty rectum: No Leakage: No Pads: Yes: see below Fiber supplement/laxative Yes - Amitiza   URINATION: Pain with urination: No Fully empty bladder: No Stream: variety - sometimes will just drip out, sometimes will be strong Urgency: Yes  Frequency: depends on water intake - sometimes she has to go every 5 minutes; at night she goes 3-4x/night Fluid Intake: working on drinking more water - sometimes one bottle will send her to the bathroom many times  Leakage: Urge to void, Walking to the bathroom, Coughing, Sneezing, and Laughing (if she already has to pee) Pads: Yes: every day - 3 pads - smooth move occasionally  INTERCOURSE:  Not currently sexually active  PREGNANCY: Vaginal deliveries  2 Tearing Yes: small tears  Episiotomy No C-section deliveries 0 Currently pregnant No  PROLAPSE: None   OBJECTIVE:  Note: Objective measures were completed at Evaluation unless otherwise noted. 10/03/23: Single leg stance:  Rt: pelvic drop, improving Lt: pelvic drop, improving  Squat: initial breath holding and LE instability, but hip adduction with ball helped to correct and make sure pelvic floor muscles were activating  PFIQ-7: 48  09/12/23: Single leg stance:  Rt: pelvic drop Lt: pelvic drop  Intra-rectal pressure: good force generation, but external anal sphincter contraction at the same time indicating dyssynergia External anal sphincter strength 3/5  08/14/23: PATIENT SURVEYS:   PFIQ-7: 90  COGNITION: Overall cognitive status: Within functional limits for tasks assessed     SENSATION: Light touch: Appears intact   FUNCTIONAL TESTS:  Squat: Lt weight shift Single leg stance:  Rt: pelvic drop  Lt: pelvic drop Curl-up test: abdominal disotrtion   GAIT: Assistive device utilized: None Comments: WNL  POSTURE: rounded shoulders and forward head   LUMBARAROM/PROM: WNL   PALPATION:   General: tightness throughout low back  Pelvic Alignment: Lt posterior rotation  Abdominal: good rib cage mobility; significant tightness/distention of abdomen - patient reports that firmness palpated is what shefeels like is her fibroids                External Perineal Exam: WNL                             Internal Pelvic Floor: no tenderness  Patient confirms identification and approves PT to assess internal pelvic floor and treatment Yes  PELVIC MMT:   MMT eval  Vaginal 4/5, 12 seconds, 6 repeat contractions; coordination poor with tendency to bear down instead of drawing in  Diastasis Recti 2 finger width separation  Rectal assessment    (Blank rows = not tested)        TONE: WNL  PROLAPSE: Grade 1 posterior wall/grade 2 anterior wall laxity (morning  tested in supine)  TODAY'S TREATMENT:  DATE:  10/03/23 Neuromuscular re-education: Bridge with hip adduction, transversus abdominus, and pelvic floor muscle 2 x 10 Bridge with hip abduction red band, transversus abdominus, and pelvic floor muscle 2 x 10 Therapeutic activities: Squat to table + pelvic floor muscles + hip adduction 2 x 10 Bil shoulder extensions in standing + pelvic floor muscles + alternating 2025/03/14x 10 green band Wide leg dead lift with cross body overhead press 5 lbs 10x bil Staggered dead lift with 15 lb kettle bell 10x bil Unilateral row in staggered stance 15 lbs with rotation 10x bil   09/25/23 Neuromuscular re-education: Transversus abdominus training with multimodal cues for improved motor control and breath coordination Transversus abdominus isometrics 10x Supine resisted hip flexion 10x bil Bridge with hip adduction, transversus abdominus, and pelvic floor muscle 2 x 10 Side lying clam shells with UE ball press 2 x 10 bil  Seated hip adduction ball press with transversus abdominus and pelvic floor muscle 2 x 10 Seated hip abduction red band with transversus abdominus and pelvic floor muscle 2 x 10 Seated resisted march red band with transversus abdominus and pelvic floor muscle 2 x 10 Therapeutic activities: Continue to review splinting and education on external anal sphincter relaxation at the same time Squat/heel raises break every 10-20 minutes when walking   09/19/23: Therapeutic activity: Pt had several questions about bowel and bladder habits and mechanics - all answered and education below Pelvic mobility with toileting as needed for improved posterior pelvic floor relaxation  Balloon breathing for assisting in evacuation  Splinting for bowel movements for evacuation Moistened wipes or peri bottle for skin cleaning after bowel  movements as pt reports pain at external anal sphincter after emptying sometimes and reports she does wipe several times currently Variations for urge drill with adding counting backwards from 10 or heel raises to improve effectiveness  Updated HEP for improved pelvic floor mobility and relaxation techniques and strength for pt to better complete at desk during work day as she reports this can be a barrier for her with exercises   09/12/23 Manual: Pt provides verbal consent for internal rectal pelvic floor exam. Internal rectal exam Neuromuscular re-education: Internal rectal push training with exhale Pelvic floor muscle strengthening program review in standing Quick flicks 10x Long holds 5 x 10 seconds Therapeutic activities: Review bladder retraining - pt has not been working on this Education on how to utilize urge drill - practice pelvic floor muscle training program in standing when she does not have urgency Concepts of pressure management and healthy pushing with bowel movements      PATIENT EDUCATION:  Education details: See above Person educated: Patient Education method: Explanation, Demonstration, Tactile cues, Verbal cues, and Handouts Education comprehension: verbalized understanding  HOME EXERCISE PROGRAM: [id number]  ASSESSMENT:  CLINICAL IMPRESSION: Pt doing much better overall. She is having daily bowel movement; however, since MD instructed her not to do this, she was encouraged to let them know what she is doing in case it is dangerous to do these together. She is having success using urge drill to help increase voiding window and decrease nocturia. She still has difficulty with urgency when she is standing, like grocery shopping, but urge drill is starting to help in these situations as she has become stronger. She has not had low back pain in the last week; believe this is due to a combination of regular bowel movements and core strengthening that she has been doing.  She was able to progress exercises into  more standing activities today in order to work on pelvic floor muscle and core strength in gravity dependent positions. She is demonstrating improved control, but continues to require multimodal cues for improves breath and core facilitation. She will continue to benefit from skilled PT intervention in order to decrease urinary urgency/incontinence, address all impairments, improve chronic constipation, and begin/progress functional strengthening program.   OBJECTIVE IMPAIRMENTS: decreased activity tolerance, decreased coordination, decreased endurance, decreased mobility, decreased ROM, decreased strength, increased fascial restrictions, increased muscle spasms, impaired flexibility, impaired tone, improper body mechanics, postural dysfunction, and pain.   ACTIVITY LIMITATIONS: bending, sleeping, and continence  PARTICIPATION LIMITATIONS: cleaning, laundry, community activity, and occupation  PERSONAL FACTORS: 1 comorbidity: medical history are also affecting patient's functional outcome.   REHAB POTENTIAL: Good  CLINICAL DECISION MAKING: Stable/uncomplicated  EVALUATION COMPLEXITY: Low   GOALS: Goals reviewed with patient? Yes  SHORT TERM GOALS: Updated 10/03/23   Pt will be independent with HEP to increase activity tolerance.   Baseline: Goal status: MET 09/12/23  2.  Pt will report low back pain no greater than 2/10 in order to perform household cleaning and laundry without pain.  Baseline: Pt states that she is not having any low back pain Goal status: IN PROGRESS 10/03/23  3.  Pt will be independent with the knack, urge suppression technique, and double voiding in order to improve bladder habits and decrease urinary incontinence.   Baseline: Pt independent with these techniques  Goal status: MET 10/03/23  4.  Pt will report 25% improvement in urinary urgency in order to have more time to focus on work, household chores, and hobbies.   Baseline: pt does feel like she is at 25% improvement now - yesterday she was going 2.5 hours without difficulty in between voids Goal status: MET 10/03/23  5.  Pt will be independent with use of squatty potty, relaxed toileting mechanics, and improved bowel movement techniques in order to increase ease of bowel movements and complete evacuation.   Baseline: using regularly Goal status: MET 09/12/23  6.  Pt will be independent with use of splinting in order to help improve ease of bowel movements.  Baseline: Pt is doing this once bowel movement is there, not sure she notices that it is helpful Goal status: MET 09/12/23  LONG TERM GOALS: Updated 10/03/23  Pt will be independent with advanced HEP.   Baseline:  Goal status: IN PROGRESS 10/03/23  2.  Pt will be able to go 2-3 hours in between voids without urgency or incontinence in order to improve QOL and perform all functional activities with less difficulty.   Baseline: patient going 2.5 hours between voids without diffiuclty Goal status: MET 10/03/23  3.  Pt will report use of no more than 1 pad a day for urinary incontinence in order to demonstrate improvements with incontinence and decrease risk of infection.  Baseline: only using 2 a day Goal status: IN PROGRESS 10/03/23  4.  Pt will report 75% improvement in pain in order to play with kids and do household chores without difficulty.  Baseline: 25% better - feels like she still has difficulty in standing Goal status: IN PROGRESS 10/03/23  5.  Pt will be able to perform single leg stance without pelvic drop in order to improve core stability to help with urinary incontinence and decrease low back pain.  Baseline: continues to have pelvic drop bil Goal status: IN PROGRESS 10/03/23  6.  Pt will have at least 4 bowel movements a week in order  to decrease pressure on bladder to allow for less urinary urgency.  Baseline: Pt is having bowel movement daily Goal status: MET 10/03/23  7.  Pt will  have at least 7 complete bowel movements a week in order to decrease pressure on bladder to allow for less urinary urgency.  Baseline:  Goal status: MET 10/03/23  PLAN:  PT FREQUENCY: 1-2x/week  PT DURATION: 6 months  PLANNED INTERVENTIONS: 97110-Therapeutic exercises, 97530- Therapeutic activity, 97112- Neuromuscular re-education, 97535- Self Care, [id number]- Manual therapy, Dry Needling, and Biofeedback  PLAN FOR NEXT SESSION: Inverted lying position; self-abdominal massage; official voiding schedule - possibly give bowel/bladder journal; start mobility/core training; review pelvic floor muscle contractions for correct performance   Josette Mares, PT, DPT08/05/258:03 AM

## 2023-10-03 NOTE — Progress Notes (Signed)
 [facility] [address] [place] [address] Dept: [phone] Dept Fax: [phone]  Acute Care Office Visit  Subjective:   Meghan Fernandez 05/22/1980 10/03/2023  Chief Complaint  Patient presents with   Toe Injury    Big toe right foot    HPI:  Discussed the use of AI scribe software for clinical note transcription with the patient, who gave verbal consent to proceed.  History of Present Illness   Meghan Fernandez is a 43 year old female who presents with numbness and pain in the right big toe.  She has been experiencing pain in her right big toe for over a week, initially localized around the proximal phalanx. The pain is persistent and intensifies with walking. There is no history of injury to the toe.  Approximately a week after the onset of pain, she began experiencing numbness in the same toe. The numbness started at the back of the toe and has since moved to different areas, now constant. The numbness is present all the time, while the pain is more pronounced during walking.  No swelling, redness, or discoloration of the toe. She has not taken any medication for the pain, stating it is tolerable but becomes bothersome during her regular walking routine, which includes walking five miles once or twice a week.     The following portions of the patient's history were reviewed and updated as appropriate: past medical history, past surgical history, family history, social history, allergies, medications, and problem list.   Patient Active Problem List   Diagnosis Date Noted   Pure hypercholesterolemia 03/14/2023   Abnormal uterine bleeding 03/14/2023   Morbid obesity (HCC) 08/31/2022   Vitamin D  deficiency 03/10/2022   Environmental allergies 02/02/2022   Obesity (BMI 30-39.9) 02/02/2022   Chronic constipation 02/02/2022   Anemia 02/02/2022   Insomnia 02/02/2022   Menorrhagia with regular cycle 02/02/2022    Urinary frequency 02/02/2022   Past Medical History:  Diagnosis Date   Allergy    Anemia    Asthma    childhood no inhaler now   External hemorrhoids with complication    Fibroids    History of asthma    childhood   History of chicken pox    Past Surgical History:  Procedure Laterality Date   COLONOSCOPY     Family History  Problem Relation Age of Onset   Colon polyps Mother    Hyperlipidemia Mother    Hypertension Mother    Peripheral vascular disease Mother    Cancer Mother        thyroid   Diabetes Mother    Hyperlipidemia Father    Hypertension Father    Cancer Father        prostate   Diabetes Sister    Diabetes Brother    Breast cancer Maternal Aunt    Colon cancer Neg Hx    Esophageal cancer Neg Hx    Rectal cancer Neg Hx    Stomach cancer Neg Hx     Current Outpatient Medications:    Cholecalciferol (VITAMIN D -3) 25 MCG (1000 UT) CAPS, Take 1 capsule by mouth daily., Disp: , Rfl:    ferrous sulfate  325 (65 FE) MG EC tablet, Take 325 mg by mouth every other day., Disp: , Rfl:    meloxicam  (MOBIC ) 7.5 MG tablet, Take 1 tablet (7.5 mg total) by mouth 2 (two) times daily for 7 days. Take with food., Disp: 14 tablet, Rfl: 0   lubiprostone  (  AMITIZA ) 8 MCG capsule, TAKE 1 CAPSULE (8 MCG TOTAL) BY MOUTH 2 (TWO) TIMES DAILY WITH A MEAL., Disp: 180 capsule, Rfl: 1   Relugolix-Estradiol-Norethind (MYFEMBREE ) 40-1-0.5 MG TABS, Take 1 tablet by mouth daily. (Patient not taking: Reported on 10/03/2023), Disp: 28 tablet, Rfl: 5 No Known Allergies   ROS: A complete ROS was performed with pertinent positives/negatives noted in the HPI. The remainder of the ROS are negative.    Objective:   Today's Vitals   10/03/23 0937  BP: 110/80  Pulse: (!) 50  Temp: 98.5 F (36.9 C)  TempSrc: Temporal  SpO2: 100%  Weight: 237 lb 9.6 oz (107.8 kg)  Height: 5' 9 (1.753 m)    GENERAL: Well-appearing, in NAD. Well nourished.  SKIN: Pink, warm and dry. No rash, lesion,  ulceration, or ecchymoses.  RESPIRATORY: Chest wall symmetrical. Respirations even and non-labored.  CARDIAC:  Peripheral pulses 2+ bilaterally.  MSK: Muscle tone and strength appropriate for age. Joints w/o tenderness, redness, or swelling. Bunion at right MTP joint with medial deviation of right great toe. Pain to right great toe at proximal phalanx region EXTREMITIES: Without clubbing, cyanosis, or edema.  NEUROLOGIC: No motor or sensory deficits. Steady, even gait.  PSYCH/MENTAL STATUS: Alert, oriented x 3. Cooperative, appropriate mood and affect.    No results found for any visits on 10/03/23.    Assessment & Plan:  Assessment and Plan    Right big toe pain and numbness Pain at the proximal phalanx with constant numbness, no swelling or discoloration. Pain worsens with walking. Possible joint inflammation or pressure irritation. - Prescribed meloxicam  7.5mg  twice daily for 7 days with food. - Advised rest and avoidance of walking or overuse. - Apply ice to the affected area. - Use Epsom salts for soaking. - Refer to foot specialist for further evaluation and possible x-rays if no improvement.  Right foot bunion Bunion may alter walking mechanics, contributing to toe pain and numbness.     Meds ordered this encounter  Medications   meloxicam  (MOBIC ) 7.5 MG tablet    Sig: Take 1 tablet (7.5 mg total) by mouth 2 (two) times daily for 7 days. Take with food.    Dispense:  14 tablet    Refill:  0    Supervising Provider:   THOMPSON, AARON B [[id number]]   No orders of the defined types were placed in this encounter.  Lab Orders  No laboratory test(s) ordered today   No images are attached to the encounter or orders placed in the encounter.  Return if symptoms worsen or fail to improve.   Rosina Senters, FNP

## 2023-10-10 ENCOUNTER — Ambulatory Visit

## 2023-10-10 DIAGNOSIS — R293 Abnormal posture: Secondary | ICD-10-CM

## 2023-10-10 DIAGNOSIS — M62838 Other muscle spasm: Secondary | ICD-10-CM | POA: Diagnosis not present

## 2023-10-10 DIAGNOSIS — M6281 Muscle weakness (generalized): Secondary | ICD-10-CM

## 2023-10-10 DIAGNOSIS — R279 Unspecified lack of coordination: Secondary | ICD-10-CM

## 2023-10-10 NOTE — Therapy (Signed)
 OUTPATIENT PHYSICAL THERAPY FEMALE PELVIC TREATMENT   Patient Name: Meghan Fernandez MRN: [id number] DOB:05/10/1980, 43 y.o., female Today's Date: 10/10/2023  END OF SESSION:  PT End of Session - 10/10/23 0804     Visit Number 9    Date for PT Re-Evaluation 01/29/24    Authorization Time Period 08/14/2023-10/12/2023 for current auth; 10/12/2023 - 11/10/2023 4 visits already approved    Authorization - Visit Number 4    Authorization - Number of Visits 4    PT Start Time 0800    PT Stop Time 0840    PT Time Calculation (min) 40 min    Activity Tolerance Patient tolerated treatment well    Behavior During Therapy [facility] for tasks assessed/performed           Past Medical History:  Diagnosis Date   Allergy    Anemia    Asthma    childhood no inhaler now   External hemorrhoids with complication    Fibroids    History of asthma    childhood   History of chicken pox    Past Surgical History:  Procedure Laterality Date   COLONOSCOPY     Patient Active Problem List   Diagnosis Date Noted   Pure hypercholesterolemia 03/14/2023   Abnormal uterine bleeding 03/14/2023   Morbid obesity (HCC) 08/31/2022   Vitamin D  deficiency 03/10/2022   Environmental allergies 02/02/2022   Obesity (BMI 30-39.9) 02/02/2022   Chronic constipation 02/02/2022   Anemia 02/02/2022   Insomnia 02/02/2022   Menorrhagia with regular cycle 02/02/2022   Urinary frequency 02/02/2022    PCP: Nedra Tinnie DELENA, NP  REFERRING PROVIDER: Erik Kieth BROCKS, MD   REFERRING DIAG: N32.81 (ICD-10-CM) - OAB (overactive bladder)  THERAPY DIAG:  Abnormal posture  Muscle weakness (generalized)  Other muscle spasm  Unspecified lack of coordination  Rationale for Evaluation and Treatment: Rehabilitation  ONSET DATE: 09/11/2013  SUBJECTIVE:                                                                                                                                                                                            SUBJECTIVE STATEMENT: Pt states that she is doing very well and continues to have daily bowel movement at [facility] Stool Scale 4. She did hear back from MD and was instructed that it is ok to take Amitiza  and smooth move tea together.     PAIN: 10/03/23 Are you having pain? Yes NPRS scale: 0/10 Pain location: low back pain (2021 started)  Pain type: aching and stiff/spasm Pain description: intermittent   Aggravating factors: bending Relieving factors: standing and moving   PRECAUTIONS:  None  RED FLAGS: None   WEIGHT BEARING RESTRICTIONS: No  FALLS:  Has patient fallen in last 6 months? No  OCCUPATION: office job  ACTIVITY LEVEL : no current exercise   PLOF: Independent  PATIENT GOALS: to improve bladder control   PERTINENT HISTORY:  External hemorrhoids, fibroids, history of asthma, chronic constipation,   BOWEL MOVEMENT: Pain with bowel movement: Yes Type of bowel movement:Type ([place] Stool Scale) 4 since she is on medication, Frequency 1x/week, and Strain yes - will manually have to get bowel movement out Fully empty rectum: No Leakage: No Pads: Yes: see below Fiber supplement/laxative Yes - Amitiza   URINATION: Pain with urination: No Fully empty bladder: No Stream: variety - sometimes will just drip out, sometimes will be strong Urgency: Yes  Frequency: depends on water intake - sometimes she has to go every 5 minutes; at night she goes 3-4x/night Fluid Intake: working on drinking more water - sometimes one bottle will send her to the bathroom many times  Leakage: Urge to void, Walking to the bathroom, Coughing, Sneezing, and Laughing (if she already has to pee) Pads: Yes: every day - 3 pads - smooth move occasionally  INTERCOURSE:  Not currently sexually active  PREGNANCY: Vaginal deliveries 2 Tearing Yes: small tears  Episiotomy No C-section deliveries 0 Currently pregnant No  PROLAPSE: None   OBJECTIVE:  Note: Objective  measures were completed at Evaluation unless otherwise noted. 10/03/23: Single leg stance:  Rt: pelvic drop, improving Lt: pelvic drop, improving  Squat: initial breath holding and LE instability, but hip adduction with ball helped to correct and make sure pelvic floor muscles were activating  PFIQ-7: 48  09/12/23: Single leg stance:  Rt: pelvic drop Lt: pelvic drop  Intra-rectal pressure: good force generation, but external anal sphincter contraction at the same time indicating dyssynergia External anal sphincter strength 3/5  08/14/23: PATIENT SURVEYS:   PFIQ-7: 90  COGNITION: Overall cognitive status: Within functional limits for tasks assessed     SENSATION: Light touch: Appears intact   FUNCTIONAL TESTS:  Squat: Lt weight shift Single leg stance:  Rt: pelvic drop  Lt: pelvic drop Curl-up test: abdominal disotrtion   GAIT: Assistive device utilized: None Comments: WNL  POSTURE: rounded shoulders and forward head   LUMBARAROM/PROM: WNL   PALPATION:   General: tightness throughout low back  Pelvic Alignment: Lt posterior rotation  Abdominal: good rib cage mobility; significant tightness/distention of abdomen - patient reports that firmness palpated is what shefeels like is her fibroids                External Perineal Exam: WNL                             Internal Pelvic Floor: no tenderness  Patient confirms identification and approves PT to assess internal pelvic floor and treatment Yes  PELVIC MMT:   MMT eval  Vaginal 4/5, 12 seconds, 6 repeat contractions; coordination poor with tendency to bear down instead of drawing in  Diastasis Recti 2 finger width separation  Rectal assessment    (Blank rows = not tested)        TONE: WNL  PROLAPSE: Grade 1 posterior wall/grade 2 anterior wall laxity (morning tested in supine)  TODAY'S TREATMENT:  DATE:  10/10/23 Neuromuscular re-education: Half kneeling reverse chop 8 lbs 15x bil Standing swiss ball and wall marches 2 x 20 Trampoline: Lateral weight shift bounce 2 min Staggered stance weight shifting bounce 1 min bil Regular stance bounce 1 min Therapeutic activities: Bil shoulder extensions in standing + pelvic floor muscles + alternating 03/06/25x 20 green band Kneeling full shoulder flexion 8 lbs 2 x 10 Squat + pelvic floor muscle 15lb kettle bell 2 x 10 to table  Staggered deadlift + pelvic floor muscle 15 lb kettle bell 12x bil   10/03/23 Neuromuscular re-education: Bridge with hip adduction, transversus abdominus, and pelvic floor muscle 2 x 10 Bridge with hip abduction red band, transversus abdominus, and pelvic floor muscle 2 x 10 Therapeutic activities: Squat to table + pelvic floor muscles + hip adduction 2 x 10 Bil shoulder extensions in standing + pelvic floor muscles + alternating 03-06-25x 10 green band Wide leg dead lift with cross body overhead press 5 lbs 10x bil Staggered dead lift with 15 lb kettle bell 10x bil Unilateral row in staggered stance 15 lbs with rotation 10x bil   09/25/23 Neuromuscular re-education: Transversus abdominus training with multimodal cues for improved motor control and breath coordination Transversus abdominus isometrics 10x Supine resisted hip flexion 10x bil Bridge with hip adduction, transversus abdominus, and pelvic floor muscle 2 x 10 Side lying clam shells with UE ball press 2 x 10 bil  Seated hip adduction ball press with transversus abdominus and pelvic floor muscle 2 x 10 Seated hip abduction red band with transversus abdominus and pelvic floor muscle 2 x 10 Seated resisted march red band with transversus abdominus and pelvic floor muscle 2 x 10 Therapeutic activities: Continue to review splinting and education on external anal sphincter relaxation at the same time Squat/heel raises break every 10-20  minutes when walking    PATIENT EDUCATION:  Education details: See above Person educated: Patient Education method: Programmer, multimedia, Facilities manager, Actor cues, Verbal cues, and Handouts Education comprehension: verbalized understanding  HOME EXERCISE PROGRAM: [id number]  ASSESSMENT:  CLINICAL IMPRESSION: Pt is doing very well with consistent bowel movements at [facility] Stool Scale 4. She also continues to see improvements with her urinary urgency and bladder control. She did very well with all exercise progressions in half kneeling, kneeling, and standing today with good form, breath control, and deep core activation. She will continue to benefit from skilled PT intervention in order to decrease urinary urgency/incontinence, address all impairments, improve chronic constipation, and begin/progress functional strengthening program.   OBJECTIVE IMPAIRMENTS: decreased activity tolerance, decreased coordination, decreased endurance, decreased mobility, decreased ROM, decreased strength, increased fascial restrictions, increased muscle spasms, impaired flexibility, impaired tone, improper body mechanics, postural dysfunction, and pain.   ACTIVITY LIMITATIONS: bending, sleeping, and continence  PARTICIPATION LIMITATIONS: cleaning, laundry, community activity, and occupation  PERSONAL FACTORS: 1 comorbidity: medical history are also affecting patient's functional outcome.   REHAB POTENTIAL: Good  CLINICAL DECISION MAKING: Stable/uncomplicated  EVALUATION COMPLEXITY: Low   GOALS: Goals reviewed with patient? Yes  SHORT TERM GOALS: Updated 10/03/23   Pt will be independent with HEP to increase activity tolerance.   Baseline: Goal status: MET 09/12/23  2.  Pt will report low back pain no greater than 2/10 in order to perform household cleaning and laundry without pain.  Baseline: Pt states that she is not having any low back pain Goal status: IN PROGRESS 10/03/23  3.  Pt will be  independent with the knack, urge suppression technique,  and double voiding in order to improve bladder habits and decrease urinary incontinence.   Baseline: Pt independent with these techniques  Goal status: MET 10/03/23  4.  Pt will report 25% improvement in urinary urgency in order to have more time to focus on work, household chores, and hobbies.  Baseline: pt does feel like she is at 25% improvement now - yesterday she was going 2.5 hours without difficulty in between voids Goal status: MET 10/03/23  5.  Pt will be independent with use of squatty potty, relaxed toileting mechanics, and improved bowel movement techniques in order to increase ease of bowel movements and complete evacuation.   Baseline: using regularly Goal status: MET 09/12/23  6.  Pt will be independent with use of splinting in order to help improve ease of bowel movements.  Baseline: Pt is doing this once bowel movement is there, not sure she notices that it is helpful Goal status: MET 09/12/23  LONG TERM GOALS: Updated 10/03/23  Pt will be independent with advanced HEP.   Baseline:  Goal status: IN PROGRESS 10/03/23  2.  Pt will be able to go 2-3 hours in between voids without urgency or incontinence in order to improve QOL and perform all functional activities with less difficulty.   Baseline: patient going 2.5 hours between voids without diffiuclty Goal status: MET 10/03/23  3.  Pt will report use of no more than 1 pad a day for urinary incontinence in order to demonstrate improvements with incontinence and decrease risk of infection.  Baseline: only using 2 a day Goal status: IN PROGRESS 10/03/23  4.  Pt will report 75% improvement in pain in order to play with kids and do household chores without difficulty.  Baseline: 25% better - feels like she still has difficulty in standing Goal status: IN PROGRESS 10/03/23  5.  Pt will be able to perform single leg stance without pelvic drop in order to improve core stability to  help with urinary incontinence and decrease low back pain.  Baseline: continues to have pelvic drop bil Goal status: IN PROGRESS 10/03/23  6.  Pt will have at least 4 bowel movements a week in order to decrease pressure on bladder to allow for less urinary urgency.  Baseline: Pt is having bowel movement daily Goal status: MET 10/03/23  7.  Pt will have at least 7 complete bowel movements a week in order to decrease pressure on bladder to allow for less urinary urgency.  Baseline:  Goal status: MET 10/03/23  PLAN:  PT FREQUENCY: 1-2x/week  PT DURATION: 6 months  PLANNED INTERVENTIONS: 97110-Therapeutic exercises, 97530- Therapeutic activity, 97112- Neuromuscular re-education, 97535- Self Care, [id number]- Manual therapy, Dry Needling, and Biofeedback  PLAN FOR NEXT SESSION: Inverted lying position; self-abdominal massage; official voiding schedule - possibly give bowel/bladder journal; start mobility/core training; review pelvic floor muscle contractions for correct performance   Josette Mares, PT, DPT08/12/258:35 AM

## 2023-10-26 ENCOUNTER — Ambulatory Visit (INDEPENDENT_AMBULATORY_CARE_PROVIDER_SITE_OTHER): Payer: Self-pay

## 2023-10-26 DIAGNOSIS — M62838 Other muscle spasm: Secondary | ICD-10-CM | POA: Diagnosis not present

## 2023-10-26 DIAGNOSIS — R293 Abnormal posture: Secondary | ICD-10-CM | POA: Diagnosis not present

## 2023-10-26 DIAGNOSIS — M6281 Muscle weakness (generalized): Secondary | ICD-10-CM

## 2023-10-26 DIAGNOSIS — R279 Unspecified lack of coordination: Secondary | ICD-10-CM | POA: Diagnosis not present

## 2023-10-26 NOTE — Therapy (Signed)
 OUTPATIENT PHYSICAL THERAPY FEMALE PELVIC TREATMENT   Patient Name: Meghan Fernandez MRN: [id number] DOB:[birth year], 43 y.o., female Today's Date: 10/26/2023  END OF SESSION:  PT End of Session - 10/26/23 0850     Visit Number 10    Date for PT Re-Evaluation 01/29/24    Authorization Type [employer]    Authorization Time Period 10/12/23-11/10/2023    Authorization - Visit Number 1    Authorization - Number of Visits 4    PT Start Time 0845    PT Stop Time 0925    PT Time Calculation (min) 40 min    Activity Tolerance Patient tolerated treatment well    Behavior During Therapy [facility] for tasks assessed/performed           Past Medical History:  Diagnosis Date   Allergy    Anemia    Asthma    childhood no inhaler now   External hemorrhoids with complication    Fibroids    History of asthma    childhood   History of chicken pox    Past Surgical History:  Procedure Laterality Date   COLONOSCOPY     Patient Active Problem List   Diagnosis Date Noted   Pure hypercholesterolemia 03/14/2023   Abnormal uterine bleeding 03/14/2023   Morbid obesity (HCC) 08/31/2022   Vitamin D  deficiency 03/10/2022   Environmental allergies 02/02/2022   Obesity (BMI 30-39.9) 02/02/2022   Chronic constipation 02/02/2022   Anemia 02/02/2022   Insomnia 02/02/2022   Menorrhagia with regular cycle 02/02/2022   Urinary frequency 02/02/2022    PCP: Nedra Tinnie DELENA, NP  REFERRING PROVIDER: Erik Kieth BROCKS, MD   REFERRING DIAG: N32.81 (ICD-10-CM) - OAB (overactive bladder)  THERAPY DIAG:  Abnormal posture  Muscle weakness (generalized)  Other muscle spasm  Unspecified lack of coordination  Rationale for Evaluation and Treatment: Rehabilitation  ONSET DATE: 09/11/2013  SUBJECTIVE:                                                                                                                                                                                           SUBJECTIVE  STATEMENT: Pt states that she is doing well with bowel movements, having one typically every day using the smooth move tea. She states that bladder is doing very well, but she still has more difficulty with urgency when she is standing. She is recognizing when she has gone too long between using the bathroom.     PAIN: 10/03/23 Are you having pain? Yes NPRS scale: 0/10 Pain location: low back pain (2021 started)  Pain type: aching and stiff/spasm Pain description: intermittent   Aggravating factors:  bending Relieving factors: standing and moving   PRECAUTIONS: None  RED FLAGS: None   WEIGHT BEARING RESTRICTIONS: No  FALLS:  Has patient fallen in last 6 months? No  OCCUPATION: office job  ACTIVITY LEVEL : no current exercise   PLOF: Independent  PATIENT GOALS: to improve bladder control   PERTINENT HISTORY:  External hemorrhoids, fibroids, history of asthma, chronic constipation,   BOWEL MOVEMENT: Pain with bowel movement: Yes Type of bowel movement:Type (Bristol Stool Scale) 4 since she is on medication, Frequency 1x/week, and Strain yes - will manually have to get bowel movement out Fully empty rectum: No Leakage: No Pads: Yes: see below Fiber supplement/laxative Yes - Amitiza   URINATION: Pain with urination: No Fully empty bladder: No Stream: variety - sometimes will just drip out, sometimes will be strong Urgency: Yes  Frequency: depends on water intake - sometimes she has to go every 5 minutes; at night she goes 3-4x/night Fluid Intake: working on drinking more water - sometimes one bottle will send her to the bathroom many times  Leakage: Urge to void, Walking to the bathroom, Coughing, Sneezing, and Laughing (if she already has to pee) Pads: Yes: every day - 3 pads - smooth move occasionally  INTERCOURSE:  Not currently sexually active  PREGNANCY: Vaginal deliveries 2 Tearing Yes: small tears  Episiotomy No C-section deliveries 0 Currently pregnant  No  PROLAPSE: None   OBJECTIVE:  Note: Objective measures were completed at Evaluation unless otherwise noted. 10/03/23: Single leg stance:  Rt: pelvic drop, improving Lt: pelvic drop, improving  Squat: initial breath holding and LE instability, but hip adduction with ball helped to correct and make sure pelvic floor muscles were activating  PFIQ-7: 48  09/12/23: Single leg stance:  Rt: pelvic drop Lt: pelvic drop  Intra-rectal pressure: good force generation, but external anal sphincter contraction at the same time indicating dyssynergia External anal sphincter strength 3/5  08/14/23: PATIENT SURVEYS:   PFIQ-7: 90  COGNITION: Overall cognitive status: Within functional limits for tasks assessed     SENSATION: Light touch: Appears intact   FUNCTIONAL TESTS:  Squat: Lt weight shift Single leg stance:  Rt: pelvic drop  Lt: pelvic drop Curl-up test: abdominal disotrtion   GAIT: Assistive device utilized: None Comments: WNL  POSTURE: rounded shoulders and forward head   LUMBARAROM/PROM: WNL   PALPATION:   General: tightness throughout low back  Pelvic Alignment: Lt posterior rotation  Abdominal: good rib cage mobility; significant tightness/distention of abdomen - patient reports that firmness palpated is what shefeels like is her fibroids                External Perineal Exam: WNL                             Internal Pelvic Floor: no tenderness  Patient confirms identification and approves PT to assess internal pelvic floor and treatment Yes  PELVIC MMT:   MMT eval  Vaginal 4/5, 12 seconds, 6 repeat contractions; coordination poor with tendency to bear down instead of drawing in  Diastasis Recti 2 finger width separation  Rectal assessment    (Blank rows = not tested)        TONE: WNL  PROLAPSE: Grade 1 posterior wall/grade 2 anterior wall laxity (morning tested in supine)  TODAY'S TREATMENT:  DATE:  10/26/23 Neuromuscular re-education: Heel slams 2 x 10 Trampoline: Lateral weight shift bounce 2 min Staggered stance weight shifting bounce 1 min bil  Regular stance bounce 1 min Therapeutic activities: Resisted side stepping red band 6 steps, 5 laps  3 way kick + red band 10x each, bil Staggered deadlift + pelvic floor muscle 15 lb kettle bell 15x bil Sumo squat 15 lb kettle bell 2 x 10   10/10/23 Neuromuscular re-education: Half kneeling reverse chop 8 lbs 15x bil Standing swiss ball and wall marches 2 x 20 Trampoline: Lateral weight shift bounce 2 min Staggered stance weight shifting bounce 1 min bil Regular stance bounce 1 min Therapeutic activities: Bil shoulder extensions in standing + pelvic floor muscles + alternating 2025/03/25x 20 green band Kneeling full shoulder flexion 8 lbs 2 x 10 Squat + pelvic floor muscle 15lb kettle bell 2 x 10 to table  Staggered deadlift + pelvic floor muscle 15 lb kettle bell 12x bil   10/03/23 Neuromuscular re-education: Bridge with hip adduction, transversus abdominus, and pelvic floor muscle 2 x 10 Bridge with hip abduction red band, transversus abdominus, and pelvic floor muscle 2 x 10 Therapeutic activities: Squat to table + pelvic floor muscles + hip adduction 2 x 10 Bil shoulder extensions in standing + pelvic floor muscles + alternating March 25, 2025x 10 green band Wide leg dead lift with cross body overhead press 5 lbs 10x bil Staggered dead lift with 15 lb kettle bell 10x bil Unilateral row in staggered stance 15 lbs with rotation 10x bil    PATIENT EDUCATION:  Education details: See above Person educated: Patient Education method: Explanation, Demonstration, Tactile cues, Verbal cues, and Handouts Education comprehension: verbalized understanding  HOME EXERCISE PROGRAM: [id number]  ASSESSMENT:  CLINICAL IMPRESSION: Pt doing very well overall  with regular bowel movements and improved urinary urgency/leaking. She is doing well with sticking to voiding schedule. Believe if she continues to work on strengthening, especially in standing, she will see improvements in urinary urgency in standing. She did very well with all standing activities today, focusing on isometric pelvic floor muscle contraction throughout repetitions. She will continue to benefit from skilled PT intervention in order to decrease urinary urgency/incontinence, address all impairments, improve chronic constipation, and begin/progress functional strengthening program.   OBJECTIVE IMPAIRMENTS: decreased activity tolerance, decreased coordination, decreased endurance, decreased mobility, decreased ROM, decreased strength, increased fascial restrictions, increased muscle spasms, impaired flexibility, impaired tone, improper body mechanics, postural dysfunction, and pain.   ACTIVITY LIMITATIONS: bending, sleeping, and continence  PARTICIPATION LIMITATIONS: cleaning, laundry, community activity, and occupation  PERSONAL FACTORS: 1 comorbidity: medical history are also affecting patient's functional outcome.   REHAB POTENTIAL: Good  CLINICAL DECISION MAKING: Stable/uncomplicated  EVALUATION COMPLEXITY: Low   GOALS: Goals reviewed with patient? Yes  SHORT TERM GOALS: Updated 10/03/23   Pt will be independent with HEP to increase activity tolerance.   Baseline: Goal status: MET 09/12/23  2.  Pt will report low back pain no greater than 2/10 in order to perform household cleaning and laundry without pain.  Baseline: Pt states that she is not having any low back pain Goal status: IN PROGRESS 10/03/23  3.  Pt will be independent with the knack, urge suppression technique, and double voiding in order to improve bladder habits and decrease urinary incontinence.   Baseline: Pt independent with these techniques  Goal status: MET 10/03/23  4.  Pt will report 25% improvement  in urinary urgency in order to have more  time to focus on work, household chores, and hobbies.  Baseline: pt does feel like she is at 25% improvement now - yesterday she was going 2.5 hours without difficulty in between voids Goal status: MET 10/03/23  5.  Pt will be independent with use of squatty potty, relaxed toileting mechanics, and improved bowel movement techniques in order to increase ease of bowel movements and complete evacuation.   Baseline: using regularly Goal status: MET 09/12/23  6.  Pt will be independent with use of splinting in order to help improve ease of bowel movements.  Baseline: Pt is doing this once bowel movement is there, not sure she notices that it is helpful Goal status: MET 09/12/23  LONG TERM GOALS: Updated 10/03/23  Pt will be independent with advanced HEP.   Baseline:  Goal status: IN PROGRESS 10/03/23  2.  Pt will be able to go 2-3 hours in between voids without urgency or incontinence in order to improve QOL and perform all functional activities with less difficulty.   Baseline: patient going 2.5 hours between voids without diffiuclty Goal status: MET 10/03/23  3.  Pt will report use of no more than 1 pad a day for urinary incontinence in order to demonstrate improvements with incontinence and decrease risk of infection.  Baseline: only using 2 a day Goal status: IN PROGRESS 10/03/23  4.  Pt will report 75% improvement in pain in order to play with kids and do household chores without difficulty.  Baseline: 25% better - feels like she still has difficulty in standing Goal status: IN PROGRESS 10/03/23  5.  Pt will be able to perform single leg stance without pelvic drop in order to improve core stability to help with urinary incontinence and decrease low back pain.  Baseline: continues to have pelvic drop bil Goal status: IN PROGRESS 10/03/23  6.  Pt will have at least 4 bowel movements a week in order to decrease pressure on bladder to allow for less urinary  urgency.  Baseline: Pt is having bowel movement daily Goal status: MET 10/03/23  7.  Pt will have at least 7 complete bowel movements a week in order to decrease pressure on bladder to allow for less urinary urgency.  Baseline:  Goal status: MET 10/03/23  PLAN:  PT FREQUENCY: 1-2x/week  PT DURATION: 6 months  PLANNED INTERVENTIONS: 97110-Therapeutic exercises, 97530- Therapeutic activity, 97112- Neuromuscular re-education, 97535- Self Care, [id number]- Manual therapy, Dry Needling, and Biofeedback  PLAN FOR NEXT SESSION: Inverted lying position; self-abdominal massage; official voiding schedule - possibly give bowel/bladder journal; start mobility/core training; review pelvic floor muscle contractions for correct performance   Josette Mares, PT, DPT08/28/258:51 AM

## 2024-02-26 ENCOUNTER — Other Ambulatory Visit: Payer: Self-pay | Admitting: Internal Medicine

## 2024-02-26 DIAGNOSIS — M79674 Pain in right toe(s): Secondary | ICD-10-CM

## 2024-02-27 NOTE — Telephone Encounter (Signed)
 Pt is requesting refill for meloxicam  (MOBIC ) 7.5 MG tablet [[id number]]  ENDED   LOV 10/03/23 LRF 10/03/23 FOV 03/15/24

## 2024-03-15 ENCOUNTER — Ambulatory Visit: Payer: Self-pay | Admitting: Nurse Practitioner

## 2024-03-15 ENCOUNTER — Encounter: Payer: Self-pay | Admitting: Nurse Practitioner

## 2024-03-15 ENCOUNTER — Ambulatory Visit: Payer: Medicaid Other | Admitting: Nurse Practitioner

## 2024-03-15 VITALS — BP 124/82 | HR 55 | Temp 97.6°F | Ht 69.0 in | Wt 238.4 lb

## 2024-03-15 DIAGNOSIS — R140 Abdominal distension (gaseous): Secondary | ICD-10-CM | POA: Diagnosis not present

## 2024-03-15 DIAGNOSIS — K5909 Other constipation: Secondary | ICD-10-CM | POA: Diagnosis not present

## 2024-03-15 DIAGNOSIS — E559 Vitamin D deficiency, unspecified: Secondary | ICD-10-CM

## 2024-03-15 DIAGNOSIS — E7800 Pure hypercholesterolemia, unspecified: Secondary | ICD-10-CM

## 2024-03-15 DIAGNOSIS — Z6835 Body mass index (BMI) 35.0-35.9, adult: Secondary | ICD-10-CM | POA: Diagnosis not present

## 2024-03-15 DIAGNOSIS — D500 Iron deficiency anemia secondary to blood loss (chronic): Secondary | ICD-10-CM | POA: Diagnosis not present

## 2024-03-15 DIAGNOSIS — Z0001 Encounter for general adult medical examination with abnormal findings: Secondary | ICD-10-CM

## 2024-03-15 DIAGNOSIS — D72819 Decreased white blood cell count, unspecified: Secondary | ICD-10-CM

## 2024-03-15 DIAGNOSIS — D259 Leiomyoma of uterus, unspecified: Secondary | ICD-10-CM

## 2024-03-15 LAB — LIPID PANEL
Cholesterol: 221 mg/dL — ABNORMAL HIGH (ref 28–200)
HDL: 68.1 mg/dL
LDL Cholesterol: 142 mg/dL — ABNORMAL HIGH (ref 10–99)
NonHDL: 152.41
Total CHOL/HDL Ratio: 3
Triglycerides: 51 mg/dL (ref 10.0–149.0)
VLDL: 10.2 mg/dL (ref 0.0–40.0)

## 2024-03-15 LAB — CBC WITH DIFFERENTIAL/PLATELET
Basophils Absolute: 0 K/uL (ref 0.0–0.1)
Basophils Relative: 0.7 % (ref 0.0–3.0)
Eosinophils Absolute: 0.1 K/uL (ref 0.0–0.7)
Eosinophils Relative: 2.6 % (ref 0.0–5.0)
HCT: 35.6 % — ABNORMAL LOW (ref 36.0–46.0)
Hemoglobin: 11.7 g/dL — ABNORMAL LOW (ref 12.0–15.0)
Lymphocytes Relative: 25 % (ref 12.0–46.0)
Lymphs Abs: 0.7 K/uL (ref 0.7–4.0)
MCHC: 33 g/dL (ref 30.0–36.0)
MCV: 85.3 fl (ref 78.0–100.0)
Monocytes Absolute: 0.4 K/uL (ref 0.1–1.0)
Monocytes Relative: 14.5 % — ABNORMAL HIGH (ref 3.0–12.0)
Neutro Abs: 1.5 K/uL (ref 1.4–7.7)
Neutrophils Relative %: 57.2 % (ref 43.0–77.0)
Platelets: 243 K/uL (ref 150.0–400.0)
RBC: 4.17 Mil/uL (ref 3.87–5.11)
RDW: 14.5 % (ref 11.5–15.5)
WBC: 2.7 K/uL — ABNORMAL LOW (ref 4.0–10.5)

## 2024-03-15 LAB — COMPREHENSIVE METABOLIC PANEL WITH GFR
ALT: 9 U/L (ref 3–35)
AST: 18 U/L (ref 5–37)
Albumin: 3.9 g/dL (ref 3.5–5.2)
Alkaline Phosphatase: 56 U/L (ref 39–117)
BUN: 10 mg/dL (ref 6–23)
CO2: 29 meq/L (ref 19–32)
Calcium: 8.8 mg/dL (ref 8.4–10.5)
Chloride: 105 meq/L (ref 96–112)
Creatinine, Ser: 0.83 mg/dL (ref 0.40–1.20)
GFR: 86.28 mL/min
Glucose, Bld: 85 mg/dL (ref 70–99)
Potassium: 3.6 meq/L (ref 3.5–5.1)
Sodium: 140 meq/L (ref 135–145)
Total Bilirubin: 0.4 mg/dL (ref 0.2–1.2)
Total Protein: 7.6 g/dL (ref 6.0–8.3)

## 2024-03-15 LAB — IRON: Iron: 34 ug/dL — ABNORMAL LOW (ref 42–145)

## 2024-03-15 LAB — FERRITIN: Ferritin: 14.8 ng/mL (ref 10.0–291.0)

## 2024-03-15 LAB — VITAMIN D 25 HYDROXY (VIT D DEFICIENCY, FRACTURES): VITD: 48.65 ng/mL (ref 30.00–100.00)

## 2024-03-15 NOTE — Patient Instructions (Signed)
 It was great to see you!  We are checking your labs today and will let you know the results via mychart/phone.   You can try an at home food sensitivity kit - [employer] is a good brand  Start back exercises daily   I placed a referral for second opinion on the fibroids  Let's follow-up in 1 year, sooner if you have concerns.  If a referral was placed today, you will be contacted for an appointment. Please note that routine referrals can sometimes take up to 3-4 weeks to process. Please call our office if you haven't heard anything after this time frame.  Take care,  Tinnie Harada, NP

## 2024-03-15 NOTE — Assessment & Plan Note (Signed)
 Chronic, ongoing. She is still taking miralax and fiber to help with regular bowel movements. She did try pelvic PT as well. Concern for fibroids causing symptoms. Placing referral to GYN for second opinion.

## 2024-03-15 NOTE — Assessment & Plan Note (Signed)
Continue vitamin D supplement 2000 units daily.  Check vitamin D level and adjust regimen based on results.

## 2024-03-15 NOTE — Assessment & Plan Note (Signed)
 Due to heavy menses with uterine fibroids. Check CBC, iron, ferritin today and treat based on results.

## 2024-03-15 NOTE — Assessment & Plan Note (Signed)
Chronic, stable. Check CMP, CBC, lipid panel and treat based on results.

## 2024-03-15 NOTE — Assessment & Plan Note (Signed)
 Fibroids have increased in size and are causing urinary frequency. Will place referral to GYN for second opinion on treatment options. She is interested in surgery.

## 2024-03-15 NOTE — Progress Notes (Signed)
 "  BP 124/82 (BP Location: Left Arm, Patient Position: Sitting, Cuff Size: Large)   Pulse (!) 55   Temp 97.6 F (36.4 C)   Ht 5' 9 (1.753 m)   Wt 238 lb 6.4 oz (108.1 kg)   LMP 03/07/2024 (Exact Date)   SpO2 100%   BMI 35.21 kg/m    Subjective:    Patient ID: Meghan Fernandez, female    DOB: 12/28/1980, 44 y.o.   MRN: [id number]  CC: Chief Complaint  Patient presents with   Annual Exam    With fasting labs, some concerns but would not say    HPI: Meghan Fernandez is a 44 y.o. female presenting on 03/15/2024 for comprehensive medical examination. Current medical complaints include:bloating  She is still experiencing bloating and constipation after eating any food. She has tried keeping a journal and is unable to determine a cause as it happens with just about any food. She went to GI and had a colonoscopy which removed 1 polyp, but was otherwise normal. She has been taking miralax, fiber, and probiotic. She also notes that her fibroids have grown and she has heavy bleeding with her menses. She tried myfebree however, it made her bleed the entire month. She would like a second opinion on her fibroids.   She currently lives with: 2 kids Menopausal Symptoms: no  Depression and Anxiety Screen done today and results listed below:     03/15/2024    9:17 AM 10/03/2023    9:37 AM 03/30/2023    8:35 AM 03/14/2023    8:11 AM 08/31/2022    8:09 AM  Depression screen PHQ 2/9  Decreased Interest 0 0 0 0 0  Down, Depressed, Hopeless 0 0 0 0 0  PHQ - 2 Score 0 0 0 0 0  Altered sleeping 0  0 0   Tired, decreased energy 0  0 0   Change in appetite 0  0 0   Feeling bad or failure about yourself  0  0 0   Trouble concentrating 0  0 0   Moving slowly or fidgety/restless 0  0 0   Suicidal thoughts 0  0 0   PHQ-9 Score 0  0  0    Difficult doing work/chores Not difficult at all         Data saved with a previous flowsheet row definition      03/15/2024    9:18 AM 03/30/2023    8:38 AM 03/14/2023     8:11 AM 02/02/2022    8:43 AM  GAD 7 : Generalized Anxiety Score  Nervous, Anxious, on Edge 0 0 0 0  Control/stop worrying 0 0 0 0  Worry too much - different things 0 0 0 0  Trouble relaxing 0 0 0 0  Restless 0 0 0 0  Easily annoyed or irritable 0 0 0 0  Afraid - awful might happen 0 0 0 0  Total GAD 7 Score 0 0 0 0  Anxiety Difficulty Not difficult at all   Not difficult at all    The patient does not have a history of falls. I did not complete a risk assessment for falls. A plan of care for falls was not documented.   Past Medical History:  Past Medical History:  Diagnosis Date   Allergy     Anemia    Asthma    childhood no inhaler now   External hemorrhoids with complication    Fibroids    History  of asthma    childhood   History of chicken pox     Surgical History:  Past Surgical History:  Procedure Laterality Date   COLONOSCOPY      Medications:  Current Outpatient Medications on File Prior to Visit  Medication Sig   Cholecalciferol (VITAMIN D -3) 25 MCG (1000 UT) CAPS Take 1 capsule by mouth daily.   Ferrous Sulfate  (IRON PO) Take by mouth daily.   ferrous sulfate  325 (65 FE) MG EC tablet Take 325 mg by mouth every other day. (Patient not taking: Reported on 03/15/2024)   No current facility-administered medications on file prior to visit.    Allergies:  Allergies[1]  Social History:  Social History   Socioeconomic History   Marital status: Single    Spouse name: Not on file   Number of children: Not on file   Years of education: Not on file   Highest education level: Not on file  Occupational History   Not on file  Tobacco Use   Smoking status: Never    Passive exposure: Never   Smokeless tobacco: Never  Vaping Use   Vaping status: Never Used  Substance and Sexual Activity   Alcohol use: No   Drug use: No   Sexual activity: Yes    Birth control/protection: None  Other Topics Concern   Not on file  Social History Narrative   Not on  file   Social Drivers of Health   Tobacco Use: Low Risk (03/15/2024)   Patient History    Smoking Tobacco Use: Never    Smokeless Tobacco Use: Never    Passive Exposure: Never  Financial Resource Strain: Not on file  Food Insecurity: Not on file  Transportation Needs: Not on file  Physical Activity: Not on file  Stress: Not on file  Social Connections: Not on file  Intimate Partner Violence: Not on file  Depression (PHQ2-9): Low Risk (03/15/2024)   Depression (PHQ2-9)    PHQ-2 Score: 0  Alcohol Screen: Not on file  Housing: Not on file  Utilities: Not on file  Health Literacy: Not on file   Tobacco Use History[2] Social History   Substance and Sexual Activity  Alcohol Use No    Family History:  Family History  Problem Relation Age of Onset   Colon polyps Mother    Hyperlipidemia Mother    Hypertension Mother    Peripheral vascular disease Mother    Cancer Mother        thyroid   Diabetes Mother    Hyperlipidemia Father    Hypertension Father    Cancer Father        prostate   Diabetes Sister    Diabetes Brother    Breast cancer Maternal Aunt    Colon cancer Neg Hx    Esophageal cancer Neg Hx    Rectal cancer Neg Hx    Stomach cancer Neg Hx     Past medical history, surgical history, medications, allergies, family history and social history reviewed with patient today and changes made to appropriate areas of the chart.   Review of Systems  Constitutional: Negative.   HENT: Negative.    Eyes: Negative.   Respiratory: Negative.    Cardiovascular: Negative.   Gastrointestinal:  Positive for constipation. Negative for abdominal pain.       Bloating  Genitourinary:  Positive for frequency and urgency.  Musculoskeletal: Negative.   Skin: Negative.   Neurological: Negative.   Psychiatric/Behavioral: Negative.     All other ROS  negative except what is listed above and in the HPI.      Objective:    BP 124/82 (BP Location: Left Arm, Patient Position:  Sitting, Cuff Size: Large)   Pulse (!) 55   Temp 97.6 F (36.4 C)   Ht 5' 9 (1.753 m)   Wt 238 lb 6.4 oz (108.1 kg)   LMP 03/07/2024 (Exact Date)   SpO2 100%   BMI 35.21 kg/m   Wt Readings from Last 3 Encounters:  03/15/24 238 lb 6.4 oz (108.1 kg)  10/03/23 237 lb 9.6 oz (107.8 kg)  05/25/23 229 lb 9.6 oz (104.1 kg)    Physical Exam Vitals and nursing note reviewed.  Constitutional:      General: She is not in acute distress.    Appearance: Normal appearance.  HENT:     Head: Normocephalic and atraumatic.     Right Ear: Tympanic membrane, ear canal and external ear normal.     Left Ear: Tympanic membrane, ear canal and external ear normal.     Mouth/Throat:     Mouth: Mucous membranes are moist.     Pharynx: No posterior oropharyngeal erythema.  Eyes:     Conjunctiva/sclera: Conjunctivae normal.  Cardiovascular:     Rate and Rhythm: Normal rate and regular rhythm.     Pulses: Normal pulses.     Heart sounds: Normal heart sounds.  Pulmonary:     Effort: Pulmonary effort is normal.     Breath sounds: Normal breath sounds.  Abdominal:     Palpations: Abdomen is soft.     Tenderness: There is no abdominal tenderness.  Musculoskeletal:        General: Normal range of motion.     Cervical back: Normal range of motion and neck supple.     Right lower leg: No edema.     Left lower leg: No edema.  Lymphadenopathy:     Cervical: No cervical adenopathy.  Skin:    General: Skin is warm and dry.  Neurological:     General: No focal deficit present.     Mental Status: She is alert and oriented to person, place, and time.     Cranial Nerves: No cranial nerve deficit.     Coordination: Coordination normal.     Gait: Gait normal.  Psychiatric:        Mood and Affect: Mood normal.        Behavior: Behavior normal.        Thought Content: Thought content normal.        Judgment: Judgment normal.     Results for orders placed or performed in visit on 08/14/23   Cervicovaginal ancillary only( [facility])   Collection Time: 08/14/23 11:40 AM  Result Value Ref Range   Neisseria Gonorrhea Negative    Chlamydia Negative    Trichomonas Negative    Bacterial Vaginitis (gardnerella) Negative    Candida Vaginitis Negative    Candida Glabrata Negative    Comment      Normal Reference Range Bacterial Vaginosis - Negative   Comment Normal Reference Range Candida Species - Negative    Comment Normal Reference Range Candida Galbrata - Negative    Comment Normal Reference Range Trichomonas - Negative    Comment Normal Reference Ranger Chlamydia - Negative    Comment      Normal Reference Range Neisseria Gonorrhea - Negative      Assessment & Plan:   Problem List Items Addressed This Visit  Digestive   Chronic constipation   Chronic, ongoing. She is still taking miralax and fiber to help with regular bowel movements. She did try pelvic PT as well. Concern for fibroids causing symptoms. Placing referral to GYN for second opinion.         Genitourinary   Uterine leiomyoma   Fibroids have increased in size and are causing urinary frequency. Will place referral to GYN for second opinion on treatment options. She is interested in surgery.       Relevant Orders   Ambulatory referral to Gynecology     Other   Anemia   Due to heavy menses with uterine fibroids. Check CBC, iron, ferritin today and treat based on results.       Relevant Medications   Ferrous Sulfate  (IRON PO)   Other Relevant Orders   CBC with Differential/Platelet   Iron   Ferritin   Vitamin D  deficiency   Continue vitamin D  supplement 2000 units daily.  Check vitamin D  level and adjust regimen based on results.      Relevant Orders   VITAMIN D  25 Hydroxy (Vit-D Deficiency, Fractures)   Morbid obesity (HCC)   BMI 35.2 associated with hyperlipidemia. Discussed nutrition, exercise.       Pure hypercholesterolemia   Chronic, stable. Check CMP, CBC, lipid panel and  treat based on results.       Relevant Orders   CBC with Differential/Platelet   Comprehensive metabolic panel with GFR   Lipid panel   Other Visit Diagnoses       Encounter for general adult medical examination with abnormal findings    -  Primary   Health maintenance reviewed and updated. Discussed nutrition, exercise. Follow-up in 1 year.     Bloating       Noted with almost every food eating. Colonoscopy only showed 1 polyp. Will check food allergy  panel and celiac panel today.   Relevant Orders   Food Allergy  Profile   Celiac Panel        Follow up plan: Return in about 1 year (around 03/15/2025) for CPE.   LABORATORY TESTING:  - Pap smear: up to date  IMMUNIZATIONS:   - Tdap: Tetanus vaccination status reviewed: last tetanus booster within 10 years. - Influenza: Declined - Pneumovax: Not applicable - Prevnar: Not applicable - HPV: Up to date - Shingrix vaccine: Not applicable  SCREENING: -Mammogram: Up to date  - Colonoscopy: Not applicable  - Bone Density: Not applicable   PATIENT COUNSELING:   Advised to take 1 mg of folate supplement per day if capable of pregnancy.   Sexuality: Discussed sexually transmitted diseases, partner selection, use of condoms, avoidance of unintended pregnancy  and contraceptive alternatives.   Advised to avoid cigarette smoking.  I discussed with the patient that most people either abstain from alcohol or drink within safe limits (<=14/week and <=4 drinks/occasion for males, <=7/weeks and <= 3 drinks/occasion for females) and that the risk for alcohol disorders and other health effects rises proportionally with the number of drinks per week and how often a drinker exceeds daily limits.  Discussed cessation/primary prevention of drug use and availability of treatment for abuse.   Diet: Encouraged to adjust caloric intake to maintain  or achieve ideal body weight, to reduce intake of dietary saturated fat and total fat, to limit  sodium intake by avoiding high sodium foods and not adding table salt, and to maintain adequate dietary potassium and calcium preferably from fresh fruits, vegetables, and low-fat dairy  products.    stressed the importance of regular exercise  Injury prevention: Discussed safety belts, safety helmets, smoke detector, smoking near bedding or upholstery.   Dental health: Discussed importance of regular tooth brushing, flossing, and dental visits.    NEXT PREVENTATIVE PHYSICAL DUE IN 1 YEAR. Return in about 1 year (around 03/15/2025) for CPE.  Meghan Fernandez     [1] No Known Allergies [2]  Social History Tobacco Use  Smoking Status Never   Passive exposure: Never  Smokeless Tobacco Never   "

## 2024-03-15 NOTE — Assessment & Plan Note (Signed)
 BMI 35.2 associated with hyperlipidemia. Discussed nutrition, exercise.

## 2024-03-18 LAB — GLIA (IGA/G) + TTG IGA
Deamidated Gliadin Abs, IgA: 3 U (ref 0–19)
Deamidated Gliadin Abs, IgG: 2 U (ref 0–19)
t-Transglutaminase (tTG) IgA: <2 U/mL (ref 0–3)

## 2024-03-20 LAB — FOOD ALLERGY PROFILE
"Macadamia Nut ": 0.19 kU/L — ABNORMAL HIGH
"Sesame Seed f10 ": 0.17 kU/L — ABNORMAL HIGH
Allergen, Salmon, f41: <0.1 kU/L
Almonds: 0.2 kU/L — ABNORMAL HIGH
Brazil Nut: <0.1 kU/L
CLASS: 0
CLASS: 0
CLASS: 0
CLASS: 0
CLASS: 0
CLASS: 0
CLASS: 2
Cashew IgE: <0.1 kU/L
Class: 0
Class: 0
Class: 0
Class: 1
Class: 2
Egg White IgE: <0.1 kU/L
Fish Cod: <0.1 kU/L
Hazelnut: 2.48 kU/L — ABNORMAL HIGH
Milk IgE: <0.1 kU/L
Peanut IgE: 0.67 kU/L — ABNORMAL HIGH
Scallop IgE: <0.1 kU/L
Shrimp IgE: 1.15 kU/L — ABNORMAL HIGH
Soybean IgE: 0.14 kU/L — ABNORMAL HIGH
Tuna IgE: <0.1 kU/L
Walnut: <0.1 kU/L
Wheat IgE: 0.21 kU/L — ABNORMAL HIGH

## 2024-03-20 LAB — INTERPRETATION:

## 2024-03-20 LAB — MISC HAZELNUT COMP PNL
Cor a1(f428): 4.91 kU/L — ABNORMAL HIGH
Cor a14(f439): <0.1 kU/L
Cor a8(f425): <0.1 kU/L
Cor a9(f440): <0.1 kU/L

## 2024-03-20 LAB — PEANUT COMPONENT PANEL REFLEX
Ara h 1 (f422): <0.1 kU/L
Ara h 2 (f423): <0.1 kU/L
Ara h 3 (f424): <0.1 kU/L
Ara h 8 (f352): 2.33 kU/L — ABNORMAL HIGH
Ara h 9 (f427: <0.1 kU/L
F447-IgE Ara h 6: <0.1 kU/L

## 2024-04-04 ENCOUNTER — Other Ambulatory Visit

## 2024-04-15 ENCOUNTER — Other Ambulatory Visit

## 2024-04-16 ENCOUNTER — Inpatient Hospital Stay: Admitting: Hematology and Oncology

## 2024-04-16 ENCOUNTER — Inpatient Hospital Stay

## 2025-03-18 ENCOUNTER — Encounter: Admitting: Nurse Practitioner
